# Patient Record
Sex: Female | Born: 1975 | Race: Black or African American | Hispanic: No | Marital: Single | Smoking: Never smoker
Health system: Southern US, Community
[De-identification: ages and names within clinical notes are randomized; demographics above are authoritative.]

## PROBLEM LIST (undated history)

## (undated) DIAGNOSIS — G43909 Migraine, unspecified, not intractable, without status migrainosus: Secondary | ICD-10-CM

## (undated) DIAGNOSIS — Z9109 Other allergy status, other than to drugs and biological substances: Secondary | ICD-10-CM

## (undated) HISTORY — PX: TONSILLECTOMY: SUR1361

---

## 1999-04-02 ENCOUNTER — Emergency Department (HOSPITAL_COMMUNITY): Admission: EM | Admit: 1999-04-02 | Discharge: 1999-04-02 | Payer: Self-pay

## 2000-03-01 ENCOUNTER — Emergency Department (HOSPITAL_COMMUNITY): Admission: EM | Admit: 2000-03-01 | Discharge: 2000-03-01 | Payer: Self-pay | Admitting: Emergency Medicine

## 2001-04-28 ENCOUNTER — Emergency Department (HOSPITAL_COMMUNITY): Admission: EM | Admit: 2001-04-28 | Discharge: 2001-04-28 | Payer: Self-pay | Admitting: Emergency Medicine

## 2001-08-16 ENCOUNTER — Emergency Department (HOSPITAL_COMMUNITY): Admission: EM | Admit: 2001-08-16 | Discharge: 2001-08-16 | Payer: Self-pay | Admitting: Emergency Medicine

## 2002-07-11 ENCOUNTER — Emergency Department (HOSPITAL_COMMUNITY): Admission: EM | Admit: 2002-07-11 | Discharge: 2002-07-11 | Payer: Self-pay | Admitting: Emergency Medicine

## 2003-08-07 ENCOUNTER — Emergency Department (HOSPITAL_COMMUNITY): Admission: EM | Admit: 2003-08-07 | Discharge: 2003-08-07 | Payer: Self-pay | Admitting: Emergency Medicine

## 2003-10-05 ENCOUNTER — Emergency Department (HOSPITAL_COMMUNITY): Admission: EM | Admit: 2003-10-05 | Discharge: 2003-10-05 | Payer: Self-pay | Admitting: Emergency Medicine

## 2004-01-27 ENCOUNTER — Emergency Department (HOSPITAL_COMMUNITY): Admission: EM | Admit: 2004-01-27 | Discharge: 2004-01-27 | Payer: Self-pay | Admitting: Emergency Medicine

## 2004-05-02 ENCOUNTER — Emergency Department (HOSPITAL_COMMUNITY): Admission: EM | Admit: 2004-05-02 | Discharge: 2004-05-02 | Payer: Self-pay

## 2004-05-28 ENCOUNTER — Emergency Department (HOSPITAL_COMMUNITY): Admission: EM | Admit: 2004-05-28 | Discharge: 2004-05-28 | Payer: Self-pay | Admitting: Emergency Medicine

## 2005-08-31 ENCOUNTER — Inpatient Hospital Stay (HOSPITAL_COMMUNITY): Admission: AD | Admit: 2005-08-31 | Discharge: 2005-08-31 | Payer: Self-pay | Admitting: *Deleted

## 2005-11-26 ENCOUNTER — Emergency Department (HOSPITAL_COMMUNITY): Admission: EM | Admit: 2005-11-26 | Discharge: 2005-11-26 | Payer: Self-pay | Admitting: Emergency Medicine

## 2007-09-21 ENCOUNTER — Inpatient Hospital Stay (HOSPITAL_COMMUNITY): Admission: AD | Admit: 2007-09-21 | Discharge: 2007-09-21 | Payer: Self-pay | Admitting: Obstetrics and Gynecology

## 2007-10-10 ENCOUNTER — Inpatient Hospital Stay (HOSPITAL_COMMUNITY): Admission: AD | Admit: 2007-10-10 | Discharge: 2007-10-10 | Payer: Self-pay | Admitting: *Deleted

## 2007-10-11 ENCOUNTER — Inpatient Hospital Stay (HOSPITAL_COMMUNITY): Admission: AD | Admit: 2007-10-11 | Discharge: 2007-10-13 | Payer: Self-pay | Admitting: *Deleted

## 2011-01-13 ENCOUNTER — Other Ambulatory Visit: Payer: Self-pay | Admitting: Orthopedic Surgery

## 2011-01-13 DIAGNOSIS — M25562 Pain in left knee: Secondary | ICD-10-CM

## 2011-01-16 ENCOUNTER — Ambulatory Visit
Admission: RE | Admit: 2011-01-16 | Discharge: 2011-01-16 | Disposition: A | Discharge status: Home / Self Care | Payer: Federal, State, Local not specified - PPO | Source: Ambulatory Visit | Attending: Orthopedic Surgery | Admitting: Orthopedic Surgery

## 2011-01-16 DIAGNOSIS — M25562 Pain in left knee: Secondary | ICD-10-CM

## 2011-04-19 NOTE — Discharge Summary (Signed)
Catherine Cox                 ACCOUNT NO.:  [id number]   MEDICAL RECORD NO.:  [id number]          PATIENT TYPE:  MAT   LOCATION:  [facility]                          FACILITY:  [facility]   PHYSICIAN:  Lenoard Aden, M.D.DATE OF BIRTH:  1976/10/09   DATE OF ADMISSION:  09/21/2007  DATE OF DISCHARGE:  09/21/2007                               DISCHARGE SUMMARY   CHIEF COMPLAINT:  Rule out labor, decreased fetal movement.   HISTORY OF PRESENT ILLNESS:  She is a 35 year old white female, G2, P1  who presents for increased frequency of contractions and decreased fetal  movement.   ALLERGIES:  CODEINE.   SOCIAL HISTORY:  Non-smoker. Non-drinker. Denies domestic or physical  violence.   PAST MEDICAL HISTORY:  History of hypertension, throat cancer, migraines  and lymphoma. She has a personal history of migraine headaches,  tonsillectomy, urinary tract infection and history of vaginal delivery  x1 with precipitous delivery noted. Prenatal course today uncomplicated.   PHYSICAL EXAMINATION:  GENERAL:  A well developed, well nourished female  in no acute distress.  VITAL SIGNS:  Blood pressure 100/70, weight 158 pounds.  HEENT:  Normal.  LUNGS:  Clear.  HEART:  Regular rhythm.  ABDOMEN:  Soft, gravid, nontender.  GENITOURINARY:  Cervix is 1 cm, thick, vertex and minus 1.  EXTREMITIES:  Without cords.  NEUROLOGIC:  Non-focal.  SKIN:  Intact.   LABORATORY DATA:  NST is reactive. Rare contractions are noted.   IMPRESSION:  1. A 36-5/7 week OB.  2. Prodromal labor pattern.  3. Reactive NST, reassurance given.   PLAN:  1. Discharge home.  2. Ambien p.r.n.  3. Followup in the office as scheduled.      Lenoard Aden, M.D.  Electronically Signed     RJT/MEDQ  D:  09/21/2007  T:  09/23/2007  Job:  [id number]

## 2011-09-13 LAB — CBC
HCT: 29 — ABNORMAL LOW
HCT: 37.5
Hemoglobin: 13.5
MCHC: 35.8
MCHC: 35.9
MCV: 96.1
Platelets: 150
RBC: 3.01 — ABNORMAL LOW
RBC: 3.97
WBC: 10.6 — ABNORMAL HIGH

## 2011-09-14 LAB — URINALYSIS, ROUTINE W REFLEX MICROSCOPIC
Bilirubin Urine: NEGATIVE
Glucose, UA: NEGATIVE
Ketones, ur: 15 — AB
Protein, ur: NEGATIVE
pH: 6

## 2011-09-14 LAB — URINE MICROSCOPIC-ADD ON: RBC / HPF: NONE SEEN

## 2013-04-18 ENCOUNTER — Emergency Department (HOSPITAL_COMMUNITY): Payer: Federal, State, Local not specified - PPO

## 2013-04-18 ENCOUNTER — Emergency Department (HOSPITAL_COMMUNITY)
Admission: EM | Admit: 2013-04-18 | Discharge: 2013-04-18 | Disposition: A | Discharge status: Home / Self Care | Payer: Federal, State, Local not specified - PPO | Attending: Emergency Medicine | Admitting: Emergency Medicine

## 2013-04-18 ENCOUNTER — Encounter (HOSPITAL_COMMUNITY): Payer: Self-pay | Admitting: Emergency Medicine

## 2013-04-18 DIAGNOSIS — S93602A Unspecified sprain of left foot, initial encounter: Secondary | ICD-10-CM

## 2013-04-18 DIAGNOSIS — Y9301 Activity, walking, marching and hiking: Secondary | ICD-10-CM | POA: Insufficient documentation

## 2013-04-18 DIAGNOSIS — Y929 Unspecified place or not applicable: Secondary | ICD-10-CM | POA: Insufficient documentation

## 2013-04-18 DIAGNOSIS — Z9109 Other allergy status, other than to drugs and biological substances: Secondary | ICD-10-CM | POA: Insufficient documentation

## 2013-04-18 DIAGNOSIS — G43909 Migraine, unspecified, not intractable, without status migrainosus: Secondary | ICD-10-CM | POA: Insufficient documentation

## 2013-04-18 DIAGNOSIS — X500XXA Overexertion from strenuous movement or load, initial encounter: Secondary | ICD-10-CM | POA: Insufficient documentation

## 2013-04-18 DIAGNOSIS — S93609A Unspecified sprain of unspecified foot, initial encounter: Secondary | ICD-10-CM | POA: Insufficient documentation

## 2013-04-18 HISTORY — DX: Migraine, unspecified, not intractable, without status migrainosus: G43.909

## 2013-04-18 HISTORY — DX: Other allergy status, other than to drugs and biological substances: Z91.09

## 2013-04-18 MED ORDER — TRAMADOL HCL 50 MG PO TABS: 50.0000 mg | ORAL_TABLET | Freq: Four times a day (QID) | ORAL | Status: DC | PRN

## 2013-04-18 NOTE — ED Notes (Signed)
Patient transported to X-ray 

## 2013-04-18 NOTE — ED Provider Notes (Signed)
Medical screening examination/treatment/procedure(s) were performed by non-physician practitioner and as supervising physician I was immediately available for consultation/collaboration.   Carleene Cooper III, MD 04/18/13 [phone]

## 2013-04-18 NOTE — ED Notes (Signed)
Pt was walking down the stairs and was at the bottom step when she twisted her left foot and heard a "pop."  Pain with walking and noted swelling.

## 2013-04-18 NOTE — ED Provider Notes (Signed)
History     CSN: [id number]  Arrival date & time 04/18/13  [id number]   First MD Initiated Contact with Patient 04/18/13 [phone]      Chief Complaint  Patient presents with  . Foot Injury    Left    (Consider location/radiation/quality/duration/timing/severity/associated sxs/prior treatment) HPI Comments: Patient presents with a chief complaint of left foot pain.  Pain associated with swelling.  Pain and swelling located over the 4th and 5th metatarsal.  She reports that last evening she twisted her left foot foot while walking down the stairs.  She reports that initially she felt some tingling, but no tingling since that time.  She took a Human resources officer with mild relief.  She has been able to ambulate since the injury.  Patient is a 37 y.o. female presenting with foot injury. The history is provided by the patient.  Foot Injury Location:  Foot Injury: yes   Foot location:  L foot Pain details:    Radiates to:  Does not radiate   Onset quality:  Sudden   Progression:  Worsening Ineffective treatments:  Ice Associated symptoms: swelling   Associated symptoms: no decreased ROM, no fever, no numbness and no tingling     Past Medical History  Diagnosis Date  . Migraine headache   . Environmental allergies     Past Surgical History  Procedure Laterality Date  . Tonsillectomy      No family history on file.  History  Substance Use Topics  . Smoking status: Never Smoker   . Smokeless tobacco: Not on file  . Alcohol Use: No    OB History   Grav Para Term Preterm Abortions TAB SAB Ect Mult Living                  Review of Systems  Constitutional: Negative for fever.  Musculoskeletal:       Left foot pain  All other systems reviewed and are negative.    Allergies  Codeine  Home Medications   Current Outpatient Rx  Name  Route  Sig  Dispense  Refill  . cyclobenzaprine (FLEXERIL) 10 MG tablet   Oral   Take 10 mg by mouth 3 (three) times daily as needed for  muscle spasms.         . medroxyPROGESTERone (DEPO-PROVERA) 150 MG/ML injection   Intramuscular   Inject 150 mg into the muscle every 3 (three) months.         . rizatriptan (MAXALT) 10 MG tablet   Oral   Take 10 mg by mouth as needed for migraine. May repeat in 2 hours if needed           BP 128/86  Pulse 109  Temp(Src) 98.7 F (37.1 C) (Oral)  Resp 18  Ht 5\' 8"  (1.727 m)  Wt 181 lb (82.101 kg)  BMI 27.53 kg/m2  SpO2 100%  Physical Exam  Nursing note and vitals reviewed. Constitutional: She appears well-developed and well-nourished. No distress.  HENT:  Head: Normocephalic and atraumatic.  Mouth/Throat: Oropharynx is clear and moist.  Cardiovascular: Normal rate, regular rhythm and normal heart sounds.   Pulses:      Dorsalis pedis pulses are 2+ on the right side, and 2+ on the left side.  Pulmonary/Chest: Effort normal and breath sounds normal.  Musculoskeletal:       Left ankle: She exhibits normal range of motion, no swelling, no deformity and normal pulse. No tenderness. No lateral malleolus and no medial malleolus tenderness found.  Tenderness and swelling of the 4th and 5th metatarsal of the left foot.  Neurological: She is alert. No sensory deficit.  Skin: Skin is warm and dry. She is not diaphoretic.  Psychiatric: She has a normal mood and affect.    ED Course  Procedures (including critical care time)  Labs Reviewed - No data to display Dg Foot Complete Left  04/18/2013   *RADIOLOGY REPORT*  Clinical Data: Foot injury with pain  LEFT FOOT - COMPLETE 3+ VIEW  Comparison: None  Findings: Negative for fracture.  Negative for arthropathy.  No focal bony abnormality.  IMPRESSION: Negative   Original Report Authenticated By: Janeece Riggers, M.D.     No diagnosis found.    MDM  Patient presenting with left foot pain after twisting it.  Xray negative.  Patient neurovascularly intact.  Patient declined crutches.  ACE wrap applied to the foot and patient  discharged home.        Pascal Lux Vining, PA-C 04/18/13 [phone]

## 2014-02-13 ENCOUNTER — Other Ambulatory Visit (HOSPITAL_COMMUNITY): Payer: Self-pay | Admitting: Obstetrics and Gynecology

## 2014-02-13 DIAGNOSIS — Z1231 Encounter for screening mammogram for malignant neoplasm of breast: Secondary | ICD-10-CM

## 2014-02-27 ENCOUNTER — Ambulatory Visit (HOSPITAL_COMMUNITY)
Admission: RE | Admit: 2014-02-27 | Discharge: 2014-02-27 | Disposition: A | Discharge status: Home / Self Care | Payer: Federal, State, Local not specified - PPO | Source: Ambulatory Visit | Attending: Obstetrics and Gynecology | Admitting: Obstetrics and Gynecology

## 2014-02-27 DIAGNOSIS — Z1231 Encounter for screening mammogram for malignant neoplasm of breast: Secondary | ICD-10-CM

## 2014-02-27 LAB — HM MAMMOGRAPHY

## 2014-08-14 ENCOUNTER — Encounter (HOSPITAL_COMMUNITY): Payer: Self-pay | Admitting: Emergency Medicine

## 2014-08-14 ENCOUNTER — Emergency Department (HOSPITAL_COMMUNITY)
Admission: EM | Admit: 2014-08-14 | Discharge: 2014-08-14 | Disposition: A | Discharge status: Home / Self Care | Payer: Federal, State, Local not specified - PPO | Attending: Emergency Medicine | Admitting: Emergency Medicine

## 2014-08-14 ENCOUNTER — Emergency Department (HOSPITAL_COMMUNITY): Payer: Federal, State, Local not specified - PPO

## 2014-08-14 DIAGNOSIS — M1710 Unilateral primary osteoarthritis, unspecified knee: Secondary | ICD-10-CM | POA: Insufficient documentation

## 2014-08-14 DIAGNOSIS — M25569 Pain in unspecified knee: Secondary | ICD-10-CM | POA: Diagnosis present

## 2014-08-14 DIAGNOSIS — G43909 Migraine, unspecified, not intractable, without status migrainosus: Secondary | ICD-10-CM | POA: Insufficient documentation

## 2014-08-14 DIAGNOSIS — M1990 Unspecified osteoarthritis, unspecified site: Secondary | ICD-10-CM

## 2014-08-14 DIAGNOSIS — Z79899 Other long term (current) drug therapy: Secondary | ICD-10-CM | POA: Insufficient documentation

## 2014-08-14 DIAGNOSIS — M25562 Pain in left knee: Secondary | ICD-10-CM

## 2014-08-14 DIAGNOSIS — IMO0002 Reserved for concepts with insufficient information to code with codable children: Principal | ICD-10-CM

## 2014-08-14 NOTE — ED Notes (Signed)
Pt states yesterday she walking and felt left knee pop and after that with walking she felt knee popping. Pt states she elevated and put ice on last night and today knee hurts worse and is swelling.

## 2014-08-14 NOTE — Discharge Instructions (Signed)
Please call your doctor for a followup appointment within 24-48 hours. When you talk to your doctor please let them know that you were seen in the emergency department and have them acquire all of your records so that they can discuss the findings with you and formulate a treatment plan to fully care for your new and ongoing problems. Please call and set-up an appointment with Orthopedics Please rest, ice, elevate - keep knee in mildly bent fashion  While taking ibuprofen please take on a full stomach for this can cause upset stomach Please wear knee sleeve for comfort especially when working Please continue to monitor symptoms closely and if symptoms are to worsen or change (fever greater than 101, chills, sweating, nausea, vomiting, chest pain, shortness of breathe, difficulty breathing, weakness, numbness, tingling, worsening or changes to pain pattern, fall, injury, hot to touch, changes to skin color) please report back to the Emergency Department immediately.   Knee Pain The knee is the complex joint between your thigh and your lower leg. It is made up of bones, tendons, ligaments, and cartilage. The bones that make up the knee are:  The femur in the thigh.  The tibia and fibula in the lower leg.  The patella or kneecap riding in the groove on the lower femur. CAUSES  Knee pain is a common complaint with many causes. A few of these causes are:  Injury, such as:  A ruptured ligament or tendon injury.  Torn cartilage.  Medical conditions, such as:  Gout  Arthritis  Infections  Overuse, over training, or overdoing a physical activity. Knee pain can be minor or severe. Knee pain can accompany debilitating injury. Minor knee problems often respond well to self-care measures or get well on their own. More serious injuries may need medical intervention or even surgery. SYMPTOMS The knee is complex. Symptoms of knee problems can vary widely. Some of the problems are:  Pain with  movement and weight bearing.  Swelling and tenderness.  Buckling of the knee.  Inability to straighten or extend your knee.  Your knee locks and you cannot straighten it.  Warmth and redness with pain and fever.  Deformity or dislocation of the kneecap. DIAGNOSIS  Determining what is wrong may be very straight forward such as when there is an injury. It can also be challenging because of the complexity of the knee. Tests to make a diagnosis may include:  Your caregiver taking a history and doing a physical exam.  Routine X-rays can be used to rule out other problems. X-rays will not reveal a cartilage tear. Some injuries of the knee can be diagnosed by:  Arthroscopy a surgical technique by which a small video camera is inserted through tiny incisions on the sides of the knee. This procedure is used to examine and repair internal knee joint problems. Tiny instruments can be used during arthroscopy to repair the torn knee cartilage (meniscus).  Arthrography is a radiology technique. A contrast liquid is directly injected into the knee joint. Internal structures of the knee joint then become visible on X-ray film.  An MRI scan is a non X-ray radiology procedure in which magnetic fields and a computer produce two- or three-dimensional images of the inside of the knee. Cartilage tears are often visible using an MRI scanner. MRI scans have largely replaced arthrography in diagnosing cartilage tears of the knee.  Blood work.  Examination of the fluid that helps to lubricate the knee joint (synovial fluid). This is done by taking  a sample out using a needle and a syringe. TREATMENT The treatment of knee problems depends on the cause. Some of these treatments are:  Depending on the injury, proper casting, splinting, surgery, or physical therapy care will be needed.  Give yourself adequate recovery time. Do not overuse your joints. If you begin to get sore during workout routines, back off.  Slow down or do fewer repetitions.  For repetitive activities such as cycling or running, maintain your strength and nutrition.  Alternate muscle groups. For example, if you are a weight lifter, work the upper body on one day and the lower body the next.  Either tight or weak muscles do not give the proper support for your knee. Tight or weak muscles do not absorb the stress placed on the knee joint. Keep the muscles surrounding the knee strong.  Take care of mechanical problems.  If you have flat feet, orthotics or special shoes may help. See your caregiver if you need help.  Arch supports, sometimes with wedges on the inner or outer aspect of the heel, can help. These can shift pressure away from the side of the knee most bothered by osteoarthritis.  A brace called an "unloader" brace also may be used to help ease the pressure on the most arthritic side of the knee.  If your caregiver has prescribed crutches, braces, wraps or ice, use as directed. The acronym for this is PRICE. This means protection, rest, ice, compression, and elevation.  Nonsteroidal anti-inflammatory drugs (NSAIDs), can help relieve pain. But if taken immediately after an injury, they may actually increase swelling. Take NSAIDs with food in your stomach. Stop them if you develop stomach problems. Do not take these if you have a history of ulcers, stomach pain, or bleeding from the bowel. Do not take without your caregiver's approval if you have problems with fluid retention, heart failure, or kidney problems.  For ongoing knee problems, physical therapy may be helpful.  Glucosamine and chondroitin are over-the-counter dietary supplements. Both may help relieve the pain of osteoarthritis in the knee. These medicines are different from the usual anti-inflammatory drugs. Glucosamine may decrease the rate of cartilage destruction.  Injections of a corticosteroid drug into your knee joint may help reduce the symptoms of an  arthritis flare-up. They may provide pain relief that lasts a few months. You may have to wait a few months between injections. The injections do have a small increased risk of infection, water retention, and elevated blood sugar levels.  Hyaluronic acid injected into damaged joints may ease pain and provide lubrication. These injections may work by reducing inflammation. A series of shots may give relief for as long as 6 months.  Topical painkillers. Applying certain ointments to your skin may help relieve the pain and stiffness of osteoarthritis. Ask your pharmacist for suggestions. Many over the-counter products are approved for temporary relief of arthritis pain.  In some countries, doctors often prescribe topical NSAIDs for relief of chronic conditions such as arthritis and tendinitis. A review of treatment with NSAID creams found that they worked as well as oral medications but without the serious side effects. PREVENTION  Maintain a healthy weight. Extra pounds put more strain on your joints.  Get strong, stay limber. Weak muscles are a common cause of knee injuries. Stretching is important. Include flexibility exercises in your workouts.  Be smart about exercise. If you have osteoarthritis, chronic knee pain or recurring injuries, you may need to change the way you exercise. This does  not mean you have to stop being active. If your knees ache after jogging or playing basketball, consider switching to swimming, water aerobics, or other low-impact activities, at least for a few days a week. Sometimes limiting high-impact activities will provide relief.  Make sure your shoes fit well. Choose footwear that is right for your sport.  Protect your knees. Use the proper gear for knee-sensitive activities. Use kneepads when playing volleyball or laying carpet. Buckle your seat belt every time you drive. Most shattered kneecaps occur in car accidents.  Rest when you are tired. SEEK MEDICAL CARE IF:   You have knee pain that is continual and does not seem to be getting better.  SEEK IMMEDIATE MEDICAL CARE IF:  Your knee joint feels hot to the touch and you have a high fever. MAKE SURE YOU:   Understand these instructions.  Will watch your condition.  Will get help right away if you are not doing well or get worse. Document Released: 09/18/2007 Document Revised: 02/13/2012 Document Reviewed: 09/18/2007 [facility] Patient Information 2015 [place], [place]. This information is not intended to replace advice given to you by your health care provider. Make sure you discuss any questions you have with your health care provider.   Emergency Department Resource Guide 1) Find a Doctor and Pay Out of Pocket Although you won't have to find out who is covered by your insurance plan, it is a good idea to ask around and get recommendations. You will then need to call the office and see if the doctor you have chosen will accept you as a new patient and what types of options they offer for patients who are self-pay. Some doctors offer discounts or will set up payment plans for their patients who do not have insurance, but you will need to ask so you aren't surprised when you get to your appointment.  2) Contact Your Local Health Department Not all health departments have doctors that can see patients for sick visits, but many do, so it is worth a call to see if yours does. If you don't know where your local health department is, you can check in your phone book. The CDC also has a tool to help you locate your state's health department, and many state websites also have listings of all of their local health departments.  3) Find a [facility] If your illness is not likely to be very severe or complicated, you may want to try a walk in clinic. These are popping up all over the country in pharmacies, drugstores, and shopping centers. They're usually staffed by nurse practitioners or physician assistants that  have been trained to treat common illnesses and complaints. They're usually fairly quick and inexpensive. However, if you have serious medical issues or chronic medical problems, these are probably not your best option.  No Primary Care Doctor: - Call Health Connect at  [phone] - they can help you locate a primary care doctor that  accepts your insurance, provides certain services, etc. - Physician Referral Service- [phone]  Chronic Pain Problems: Organization         Address  Phone   Notes  [facility]  [phone] Patients need to be referred by their primary care doctor.   Medication Assistance: Organization         Address  Phone   Notes  [facility] Medication [facility] [place], [place] [address] [phone] --Must be a resident of [place]  [place] -- Must have NO insurance coverage whatsoever (no Medicaid/ Medicare, etc.) -- The pt. MUST have a primary care doctor that directs their care regularly and follows them in the community   MedAssist  [phone]   [employer]  [phone]    Agencies that provide inexpensive medical care: Organization         Address  Phone   Notes  [place]  [phone]   [facility]    [phone]   [facility] [place], [place] [address] [phone]   [place] [address] [place] [phone]   Planned Parenthood    [phone]   [facility]    [phone]   [place] and [address], [place] Phone:  [phone], Fax:  [phone] Hours of Operation:  9 am - 6 pm, M-F.  Also accepts Medicaid/Medicare and self-pay.  [facility] for [place] [address], [address], [place] Phone: [phone], Fax: [phone]. Hours of Operation:  8:30 am - 5:30 pm, M-F.  Also accepts Medicaid and  self-pay.  [facility] [place] [address] [place] Phone: [phone]   [place], [place], [place] [phone], Ext. [phone] Mondays & Thursdays: 7-9 AM.  First 15 patients are seen on a first come, first serve basis.    [place] Providers:  Organization         Address  Phone   Notes  [facility] [address], [place] [phone] Also accepts self-pay patients.  [facility] [address], [place]  [phone]   [place], [address], [place] [phone]   [facility] [address] [place] [phone]   Lucianne Lei [address], [place]   [phone] Only accepts [place] Access [place] patients after they have their name applied to their card.   Self-Pay (no insurance) in [facility]:  Organization         Address  Phone   Notes  Sickle Cell Patients, [facility] [place] [phone]   [facility] Urgent Care [place] [phone]   [facility] [place]  [place], [place], [place] [phone]   [facility]/Dr. Osei-Bonsu  [address], [place] or [address], [place] [phone] Phone number for both [place] and [place] locations is the same.  Urgent Medical and [facility] [address] [place] [phone]   [facility] [address] [place] or [address] [phone]   [facility] [address] [place] [phone], phone; [phone], fax Sees patients 1st and 3rd Saturday of every month.  Must not qualify for public or private insurance (i.e. Medicaid, Medicare, [place] Health Choice, Veterans' Benefits)  Household income  should be no more than 200% of the poverty level The clinic cannot treat you if you are pregnant or think you are pregnant  Sexually transmitted diseases are not treated at the clinic.    Dental Care: Organization         Address  Phone  Notes  [facility] [place] [phone] Accepts children up to age 77 who are enrolled in [place] or [place]; pregnant women with a Medicaid card; and children who have applied for Medicaid or [place] Health Choice, but were declined, whose parents can pay a reduced fee at time of service.  [facility]  [address], [place] [phone] Accepts children up to age 41 who are enrolled in [place] or [place]; pregnant women with a Medicaid card; and children who have applied for Medicaid or  Health Choice, but were declined, whose parents can pay a reduced fee at time of service.  [place] Adult Dental Access PROGRAM  [place] [phone] Patients are seen by appointment only. Walk-ins are not accepted. [place] will see patients 54 years of age and older. Monday - Tuesday (8am-5pm) Most Wednesdays (8:30-5pm) $30 per visit, cash only  [facility] Adult Dental Access PROGRAM  [address], [facility] [phone] Patients are seen by appointment only. Walk-ins are not accepted. [place] will see patients 24 years of age and older. One Wednesday Evening (Monthly: Volunteer Based).  $30 per visit, cash only  [place]  [phone] for adults; Children under age 17, call Graduate Pediatric Dentistry at [phone]. Children aged 30-14, please call [phone] to request a pediatric application.  Dental services are provided in all areas of dental care including fillings, crowns and bridges, complete and partial dentures, implants, gum treatment,  root canals, and extractions. Preventive care is also provided. Treatment is provided to both adults and children. Patients are selected via a lottery and there is often a waiting list.   [facility] [place] [address] [place]  [phone] [email or web address]   [facility] [address] [place], [place] [phone], Ext. [phone] Second and Fourth Thursday of each month, opens at 6:30 AM; Clinic ends at 9 AM.  Patients are seen on a first-come first-served basis, and a limited number are seen during each clinic.   [facility]  [address] [place] Danker [place], [place] [phone]   Eligibility Requirements You must have lived in [place], [place], or [place] counties for at least the last three months.   You cannot be eligible for state or federal sponsored [employer], including [employer], [place], or [employer].   You generally cannot be eligible for healthcare insurance through your employer.    How to apply: Eligibility screenings are held every Tuesday and Wednesday afternoon from 1:00 pm until 4:00 pm. You do not need an appointment for the interview!  [facility] [address] [place], [place]   [place]  [place] Department  [place]  [phone]    Behavioral Health Resources in the Community: Intensive Outpatient Programs Organization         Address  Phone  Notes  [place]. [address], [place], [place] [phone]   [facility] Outpatient [address] [place], [place]   ADS: Alcohol & Drug Svcs [address] [place], [place]   [place] [address],  [employer], [place] or [phone]   Substance Abuse Resources Organization  Address  Phone  Notes  Alcohol and Drug Services   [phone]   [employer]  [phone]   [facility] [place]  [phone]   Chinita Pester  [phone]   Residential & Outpatient Substance Abuse Program  [phone]   Psychological Services Organization         Address  Phone  Notes  [facility] [place]  [place]  [phone]   [place] [address] [place] or [phone]    Mobile Crisis Teams Organization         Address  Phone  Notes  [facility], Mobile Crisis Care Unit  [phone]   [facility]  [address] [place], [place]   [place] [address] [place] [phone]    Self-Help/Support Groups Organization         Address  Phone             Notes  [place]. of [place] - variety of support groups  [place] Call for more information  Narcotics Anonymous (NA), [facility] [address] [place] [address], [employer]   2 meetings at this location   Special educational needs teacher         Address  Phone  Notes  ASAP Residential Treatment [place],    [place]  [phone]   [facility]  [address] [place] [address], [place], [place]   [place], [place] [phone] Admissions: 8am-3pm M-F  Incentives Substance [place] [address],    [place], [place] [phone]   [facility] [address] [place], [employer], [place]   [facility] [address]  [place], [place]   [facility] Programs - Intensive Outpatient Center [address] [place]een [address], [place], [place]   [facility] [place].,  [place], [place] [phone] or [phone]   [facility] (RT[facility]) [address], [place], [place] Accepts Medicaid  [facility] [place] [address],  [place] [phone] Substance Abuse/Addiction Treatment   [facility] Organization         Address  Phone  Notes  [facility]  [phone]   Domenic Schwab, PhD [address] Arlis Porta [place], [place]   [phone] or [phone]   [place], [place] [phone]   [facility] [address] [place], [place] [phone] Insurance/Medicaid/sponsorship through [facility] and Families [address], [address] [place]                                    [place], [place] [phone] [place] [address], [place] [phone]    Dr. Adele Schilder  [phone]   [facility] Dept. 1) [address] [place] 2) [place] 3)  [place] [address], [place] [phone]  [phone]   [place] [phone] or [phone] (After Hours)

## 2014-08-14 NOTE — ED Provider Notes (Signed)
Medical screening examination/treatment/procedure(s) were conducted as a shared visit with non-physician practitioner(s) and myself.  I personally evaluated the patient during the encounter.   EKG Interpretation None      I interviewed and examined the patient. Lungs are CTAB. Cardiac exam wnl. Abdomen soft.  Mild ttp of medial and inf patellar area of left knee. Normal rom of left knee. Possible sprain. Will rec RICE and f/u as needed.   Pamella Pert, MD 08/14/14 [phone]

## 2014-08-14 NOTE — ED Notes (Signed)
Patient to xray.

## 2014-08-14 NOTE — ED Provider Notes (Signed)
CSN: [id number]     Arrival date & time 08/14/14  1638 History   First MD Initiated Contact with Patient 08/14/14 [phone]     Chief Complaint  Patient presents with  . Knee Pain     (Consider location/radiation/quality/duration/timing/severity/associated sxs/prior Treatment) The history is provided by the patient. No language interpreter was used.  Catherine Cox is a 38 y/o F with PMHx of migraines and environmental allergies presenting to the ED with left knee pain. Patient reported that she felt a popping sensation in her left knee resulting in a constant pain - described as a throbbing dull ache, denied radiation. Stated that when she got home she elevated, ice to the left knee along with ibuprofen. 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 Denied previous injury, fall, injury, numbness, tingling, loss sensation. PCP none  Past Medical History  Diagnosis Date  . Migraine headache   . Environmental allergies    Past Surgical History  Procedure Laterality Date  . Tonsillectomy     No family history on file. History  Substance Use Topics  . Smoking status: Never Smoker   . Smokeless tobacco: Not on file  . Alcohol Use: No   OB History   Grav Para Term Preterm Abortions TAB SAB Ect Mult Living                 Review of Systems  Musculoskeletal: Positive for arthralgias (Left knee pain). Negative for gait problem and myalgias.  Neurological: Negative for weakness and numbness.      Allergies  Codeine  Home Medications   Prior to Admission medications   Medication Sig Start Date End Date Taking? Authorizing Provider  cyclobenzaprine (FLEXERIL) 10 MG tablet Take 10 mg by mouth 3 (three) times daily as needed for muscle spasms.    Historical Provider, MD  medroxyPROGESTERone (DEPO-PROVERA) 150 MG/ML injection Inject 150 mg into the muscle every 3 (three) months.    Historical  Provider, MD  rizatriptan (MAXALT) 10 MG tablet Take 10 mg by mouth as needed for migraine. May repeat in 2 hours if needed    Historical Provider, MD  traMADol (ULTRAM) 50 MG tablet Take 1 tablet (50 mg total) by mouth every 6 (six) hours as needed for pain. 04/18/13   Heather Laisure, PA-C   BP 130/89  Pulse 83  Temp(Src) 98.5 F (36.9 C) (Oral)  Resp 20  SpO2 100% Physical Exam  Nursing note and vitals reviewed. Constitutional: She is oriented to person, place, and time. She appears well-developed and well-nourished. No distress.  HENT:  Head: Normocephalic and atraumatic.  Eyes: Conjunctivae and EOM are normal. Right eye exhibits no discharge. Left eye exhibits no discharge.  Cardiovascular: Normal rate, regular rhythm and normal heart sounds.   Pulses:      Radial pulses are 2+ on the right side, and 2+ on the left side.       Dorsalis pedis pulses are 2+ on the right side, and 2+ on the left side.  Pulmonary/Chest: Effort normal and breath sounds normal.  Musculoskeletal: Normal range of motion. She exhibits edema and tenderness.       Legs: Negative swelling, erythema, inflammation, lesions, sores, deformities, malalignment identified to the left knee. Discomfort upon palpation to the medial and inferior aspect of the patella. Negative warmth upon palpation. Full flexion extension identified. Negative valgus and varus tension. Stable left knee  joint.  Neurological: She is alert and oriented to person, place, and time. She exhibits normal muscle tone. Coordination normal.  Strength 5+/5+ to lower extremities bilaterally with resistance applied, equal distribution noted Strength intact to digits of the feet bilaterally  Sensation intact with differentiation to sharp and dull touch   Skin: Skin is warm and dry. No rash noted. She is not diaphoretic. No erythema.  Psychiatric: She has a normal mood and affect. Her behavior is normal. Thought content normal.    ED Course  Procedures  (including critical care time) Labs Review Labs Reviewed - No data to display  Imaging Review Dg Knee Complete 4 Views Left  08/14/2014   CLINICAL DATA:  Pain and swelling below the patella.  Knee pain.  EXAM: LEFT KNEE - COMPLETE 4+ VIEW  COMPARISON:  MRI of the left knee 01/16/2011  FINDINGS: The knee is located. There is no significant effusion. 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 No acute osseous abnormality is present. There is no radiopaque foreign body.  IMPRESSION: 1. Mild tricompartmental degenerative change. 2. No acute abnormality or focal soft tissue injury.   Electronically Signed   By: Lawrence Santiago M.D.   On: 08/14/2014 08:45     EKG Interpretation None      MDM   Final diagnoses:  Left knee pain  Arthritis    Filed Vitals:   08/14/14 0818  BP: 130/89  Pulse: 83  Temp: 98.5 F (36.9 C)  TempSrc: Oral  Resp: 20  SpO2: 100%   Left knee plain film noted mild tricompartmental degenerative change with no acute abnormalities or focal soft tissue injury. Doubt septic joint. Negative signs of ischemia. Imaging unremarkable. Patient neurovascularly intact. Full range of motion noted. Pulses palpable and strong. Patient stable, afebrile. Patient septic appearing. Discharged patient. Suspicion of pain to be due to arthritis the knee. Discussed with patient to rest, ice, elevate. Patient placed in the sleeve. Referred to orthopedics. Discussed with patient to closely monitor symptoms and if symptoms are to worsen or change to report back to the ED - strict return instructions given.  Patient agreed to plan of care, understood, all questions answered.   Jamse Mead, PA-C 08/14/14 [phone]

## 2015-03-24 LAB — HM MAMMOGRAPHY

## 2015-10-23 LAB — HEMOGLOBIN A1C: Hemoglobin A1C: 5.5

## 2015-10-23 LAB — VITAMIN D 25 HYDROXY (VIT D DEFICIENCY, FRACTURES): VIT D 25 HYDROXY: 20.3

## 2015-10-23 LAB — HM PAP SMEAR: HM Pap smear: NEGATIVE

## 2016-03-24 DIAGNOSIS — Z1231 Encounter for screening mammogram for malignant neoplasm of breast: Secondary | ICD-10-CM | POA: Diagnosis not present

## 2016-03-24 LAB — HM MAMMOGRAPHY

## 2016-04-18 DIAGNOSIS — Z3042 Encounter for surveillance of injectable contraceptive: Secondary | ICD-10-CM | POA: Diagnosis not present

## 2016-05-04 IMAGING — CR DG KNEE COMPLETE 4+V*L*
5 series · 5 of 5 positions shown · non-contrast
Comparison: MRI of the left knee 01/16/2011

CLINICAL DATA: Pain and swelling below the patella.  Knee pain.

EXAM:
LEFT KNEE - COMPLETE 4+ VIEW

[t knee ap left]
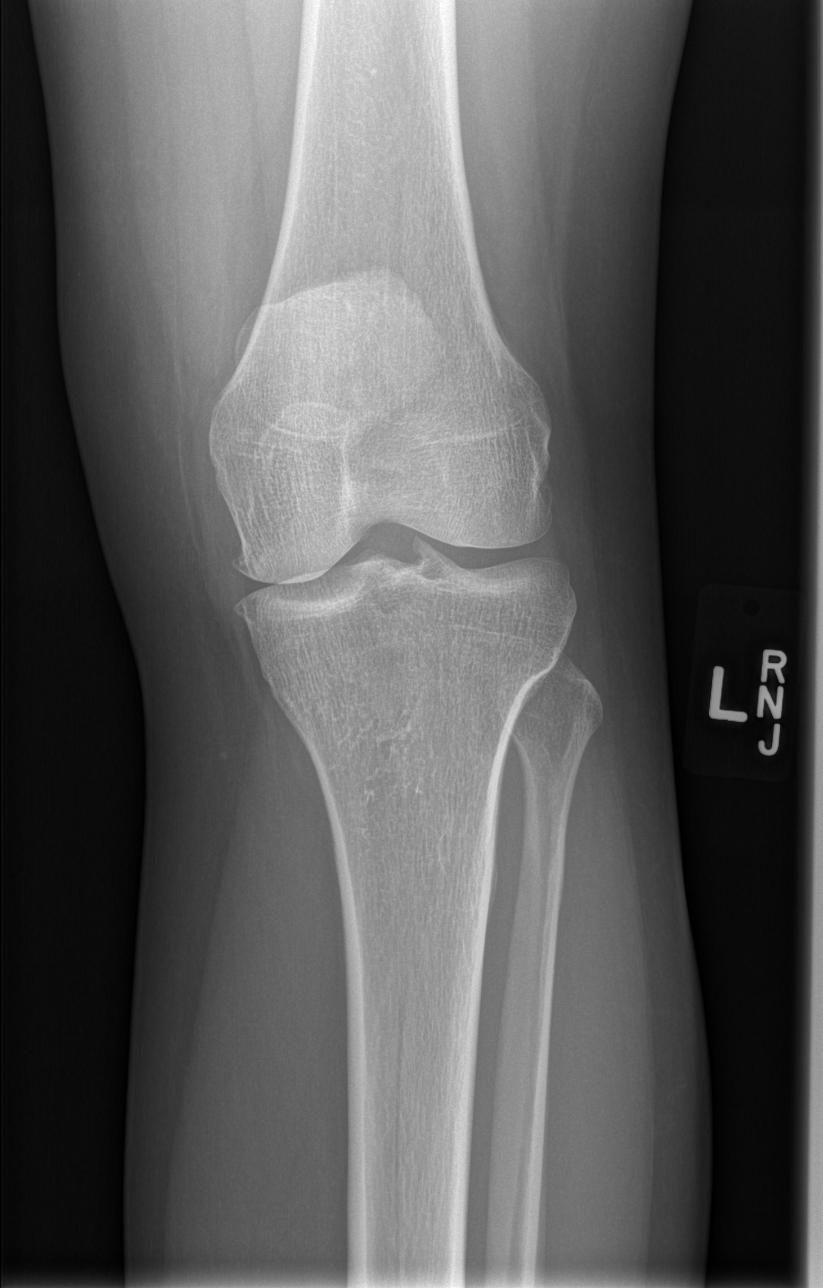

[t knee obl left (1 of 2)]
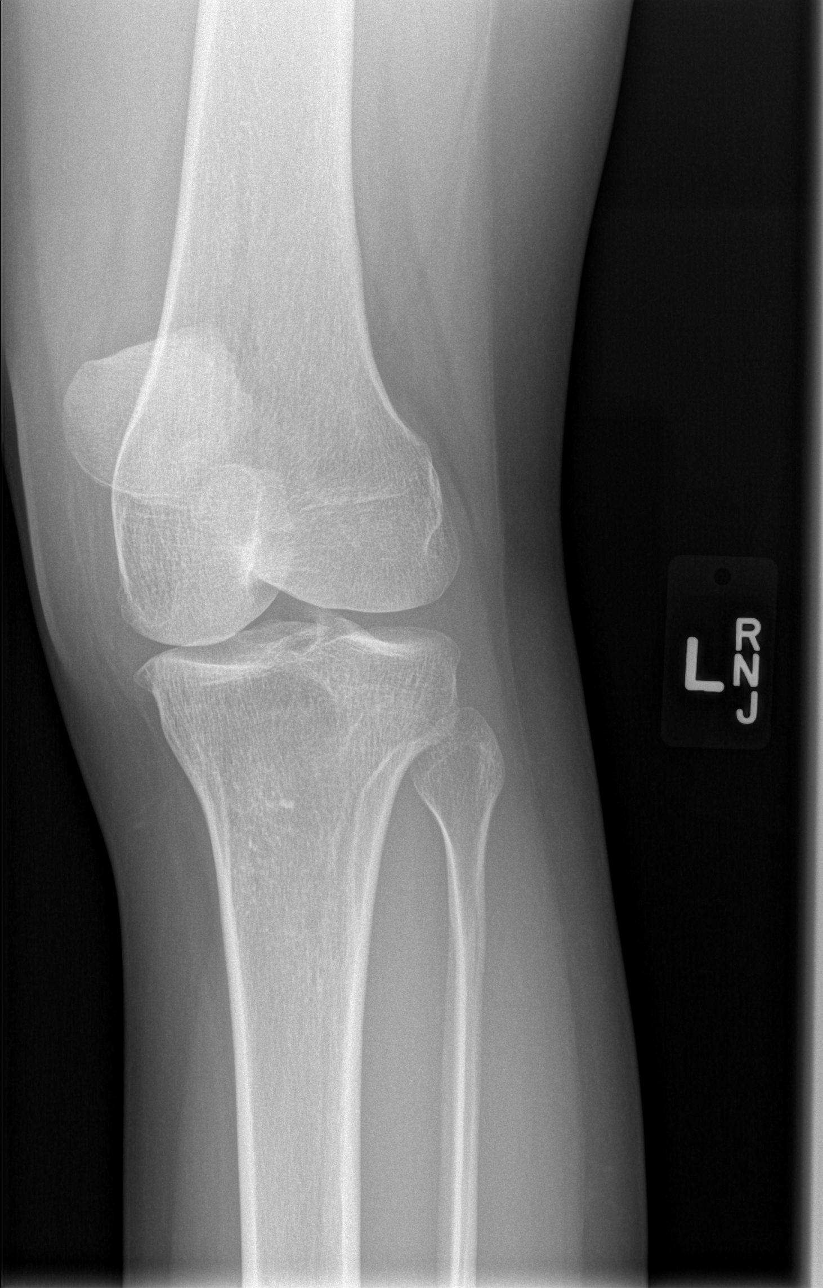

[t knee obl left (2 of 2)]
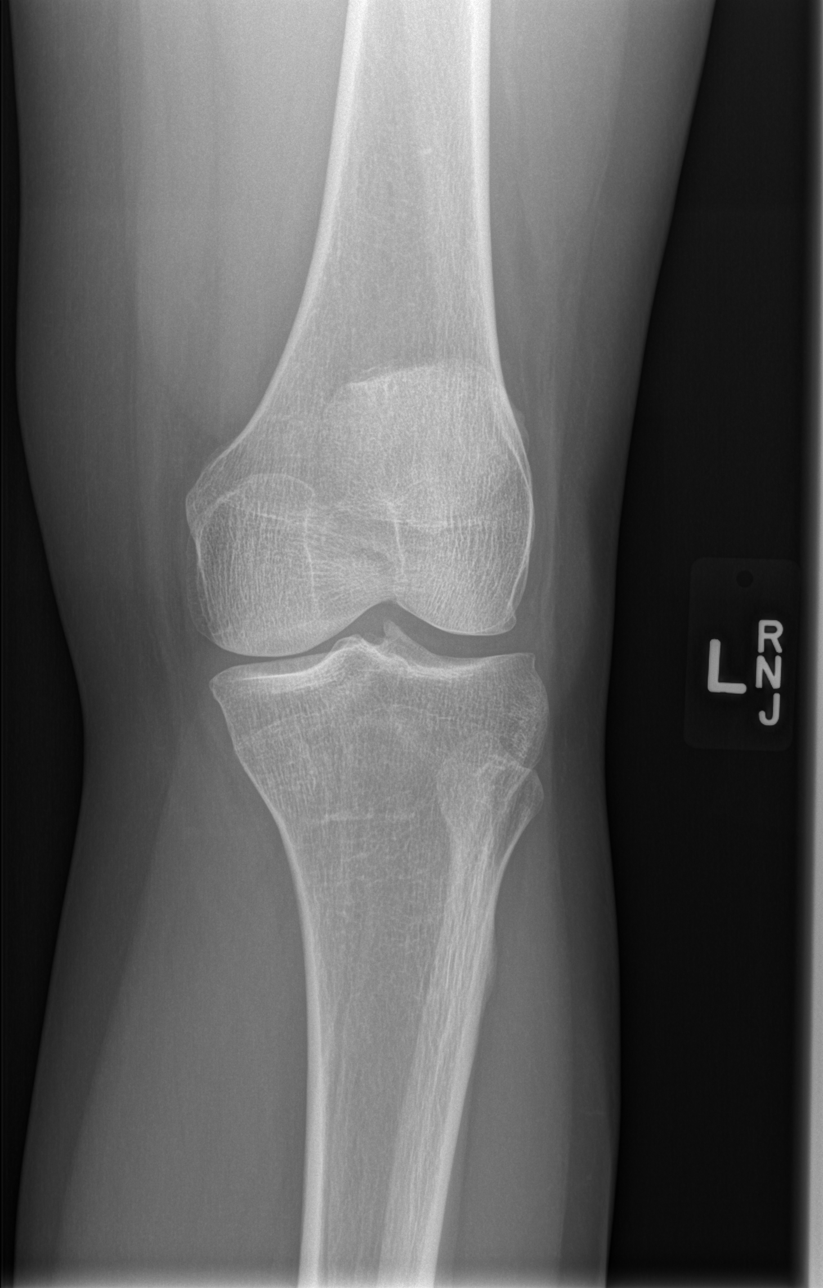

[t knee lat left (1 of 2)]
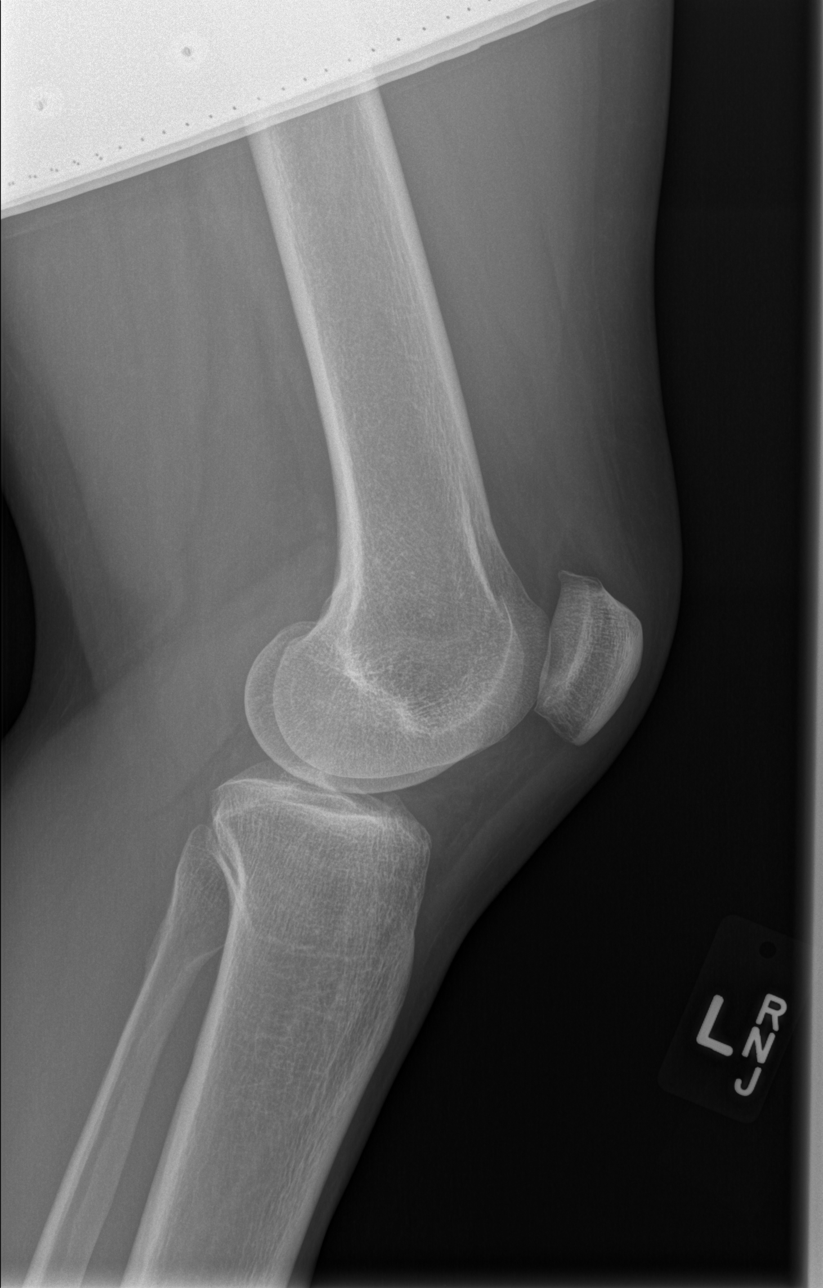

[t knee lat left (2 of 2)]
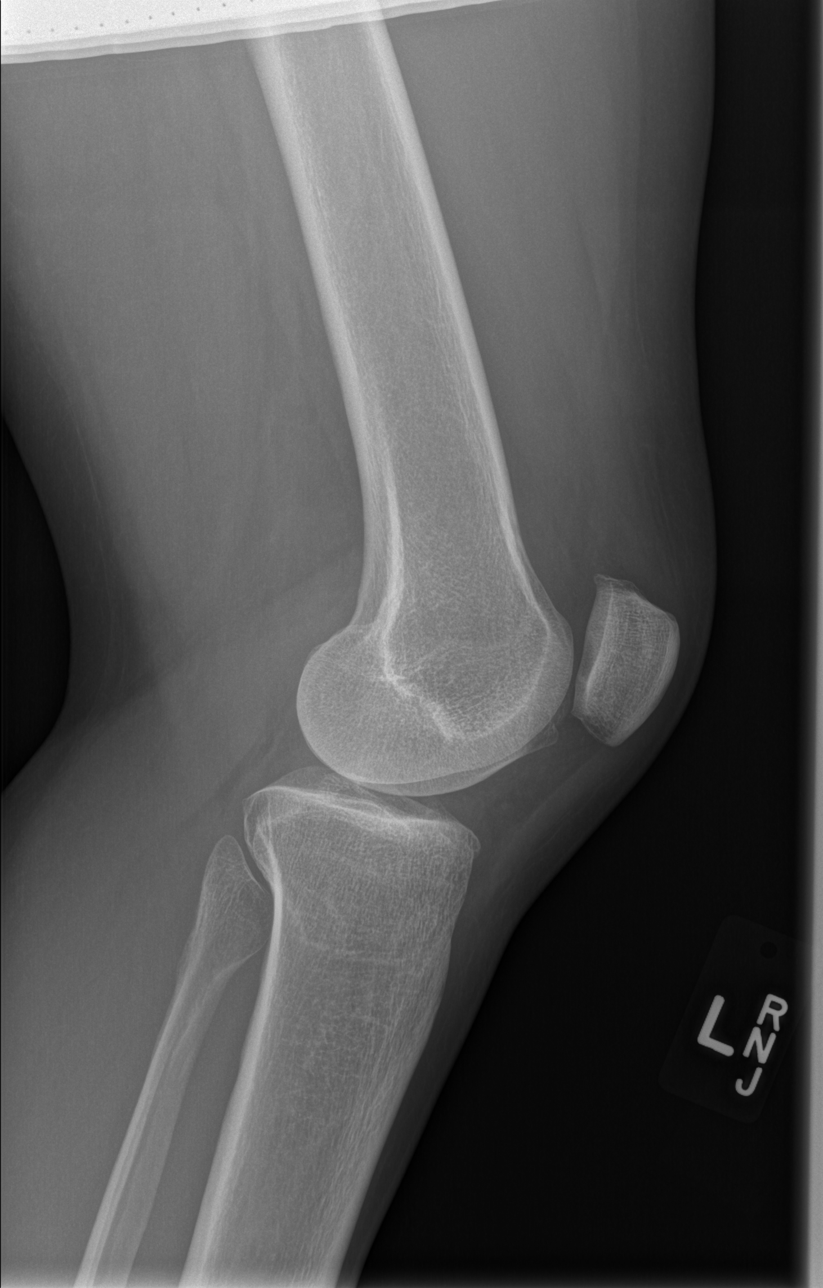

[5 of 5 positions shown; findings below may reference images not displayed]

FINDINGS: The knee is located. There is no significant effusion. Mild
tricompartmental degenerative changes are again noted. No acute
osseous abnormality is present. There is no radiopaque foreign body.
IMPRESSION: 1. Mild tricompartmental degenerative change.
2. No acute abnormality or focal soft tissue injury.

## 2016-06-14 DIAGNOSIS — G43719 Chronic migraine without aura, intractable, without status migrainosus: Secondary | ICD-10-CM | POA: Diagnosis not present

## 2016-06-14 DIAGNOSIS — G43019 Migraine without aura, intractable, without status migrainosus: Secondary | ICD-10-CM | POA: Diagnosis not present

## 2016-06-15 DIAGNOSIS — Z3042 Encounter for surveillance of injectable contraceptive: Secondary | ICD-10-CM | POA: Diagnosis not present

## 2016-08-15 DIAGNOSIS — Z3042 Encounter for surveillance of injectable contraceptive: Secondary | ICD-10-CM | POA: Diagnosis not present

## 2016-10-01 DIAGNOSIS — R05 Cough: Secondary | ICD-10-CM | POA: Diagnosis not present

## 2016-10-01 DIAGNOSIS — J0141 Acute recurrent pansinusitis: Secondary | ICD-10-CM | POA: Diagnosis not present

## 2016-10-14 DIAGNOSIS — Z3042 Encounter for surveillance of injectable contraceptive: Secondary | ICD-10-CM | POA: Diagnosis not present

## 2016-11-03 DIAGNOSIS — J209 Acute bronchitis, unspecified: Secondary | ICD-10-CM | POA: Diagnosis not present

## 2016-11-03 DIAGNOSIS — J0140 Acute pansinusitis, unspecified: Secondary | ICD-10-CM | POA: Diagnosis not present

## 2016-11-04 DIAGNOSIS — G43719 Chronic migraine without aura, intractable, without status migrainosus: Secondary | ICD-10-CM | POA: Diagnosis not present

## 2016-11-04 DIAGNOSIS — G43019 Migraine without aura, intractable, without status migrainosus: Secondary | ICD-10-CM | POA: Diagnosis not present

## 2016-11-22 DIAGNOSIS — G43909 Migraine, unspecified, not intractable, without status migrainosus: Secondary | ICD-10-CM | POA: Insufficient documentation

## 2016-12-01 DIAGNOSIS — Z113 Encounter for screening for infections with a predominantly sexual mode of transmission: Secondary | ICD-10-CM | POA: Diagnosis not present

## 2016-12-01 DIAGNOSIS — Z01419 Encounter for gynecological examination (general) (routine) without abnormal findings: Secondary | ICD-10-CM | POA: Diagnosis not present

## 2016-12-01 DIAGNOSIS — Z1151 Encounter for screening for human papillomavirus (HPV): Secondary | ICD-10-CM | POA: Diagnosis not present

## 2016-12-01 DIAGNOSIS — Z6825 Body mass index (BMI) 25.0-25.9, adult: Secondary | ICD-10-CM | POA: Diagnosis not present

## 2016-12-01 LAB — HM PAP SMEAR: HM PAP: NEGATIVE

## 2016-12-12 DIAGNOSIS — Z3042 Encounter for surveillance of injectable contraceptive: Secondary | ICD-10-CM | POA: Diagnosis not present

## 2016-12-30 DIAGNOSIS — H6123 Impacted cerumen, bilateral: Secondary | ICD-10-CM | POA: Diagnosis not present

## 2016-12-30 DIAGNOSIS — J342 Deviated nasal septum: Secondary | ICD-10-CM | POA: Diagnosis not present

## 2016-12-30 DIAGNOSIS — J320 Chronic maxillary sinusitis: Secondary | ICD-10-CM | POA: Diagnosis not present

## 2016-12-30 DIAGNOSIS — J322 Chronic ethmoidal sinusitis: Secondary | ICD-10-CM | POA: Diagnosis not present

## 2017-02-07 DIAGNOSIS — Z3040 Encounter for surveillance of contraceptives, unspecified: Secondary | ICD-10-CM | POA: Diagnosis not present

## 2017-04-04 DIAGNOSIS — Z1231 Encounter for screening mammogram for malignant neoplasm of breast: Secondary | ICD-10-CM | POA: Diagnosis not present

## 2017-04-04 DIAGNOSIS — Z3042 Encounter for surveillance of injectable contraceptive: Secondary | ICD-10-CM | POA: Diagnosis not present

## 2017-04-04 LAB — HM MAMMOGRAPHY

## 2017-04-06 DIAGNOSIS — G43719 Chronic migraine without aura, intractable, without status migrainosus: Secondary | ICD-10-CM | POA: Diagnosis not present

## 2017-04-06 DIAGNOSIS — G43019 Migraine without aura, intractable, without status migrainosus: Secondary | ICD-10-CM | POA: Diagnosis not present

## 2017-05-31 DIAGNOSIS — Z3042 Encounter for surveillance of injectable contraceptive: Secondary | ICD-10-CM | POA: Diagnosis not present

## 2017-07-11 DIAGNOSIS — G43719 Chronic migraine without aura, intractable, without status migrainosus: Secondary | ICD-10-CM | POA: Diagnosis not present

## 2017-07-11 DIAGNOSIS — G43019 Migraine without aura, intractable, without status migrainosus: Secondary | ICD-10-CM | POA: Diagnosis not present

## 2017-07-20 ENCOUNTER — Encounter: Payer: Self-pay | Admitting: Physician Assistant

## 2017-07-20 ENCOUNTER — Ambulatory Visit (INDEPENDENT_AMBULATORY_CARE_PROVIDER_SITE_OTHER): Payer: Federal, State, Local not specified - PPO | Admitting: Physician Assistant

## 2017-07-20 ENCOUNTER — Telehealth: Payer: Self-pay | Admitting: *Deleted

## 2017-07-20 VITALS — BP 162/102 | HR 95 | Temp 99.3°F | Ht 68.0 in | Wt 162.4 lb

## 2017-07-20 DIAGNOSIS — R030 Elevated blood-pressure reading, without diagnosis of hypertension: Secondary | ICD-10-CM | POA: Diagnosis not present

## 2017-07-20 DIAGNOSIS — G43819 Other migraine, intractable, without status migrainosus: Secondary | ICD-10-CM | POA: Diagnosis not present

## 2017-07-20 MED ORDER — KETOROLAC TROMETHAMINE 60 MG/2ML IM SOLN
60.0000 mg | Freq: Once | INTRAMUSCULAR | Status: AC
  Administered 2017-07-20: 30 mg via INTRAMUSCULAR

## 2017-07-20 NOTE — Patient Instructions (Addendum)
It was great to meet you!  Keep an eye on your blood pressure. If it consistently remains elevated, please let us know. If you develop any stroke like symptoms, please go to the emergency room!   Migraine Headache A migraine headache is an intense, throbbing pain on one side or both sides of the head. Migraines may also cause other symptoms, such as nausea, vomiting, and sensitivity to light and noise. What are the causes? Doing or taking certain things may also trigger migraines, such as:  Alcohol.  Smoking.  Medicines, such as: ? Medicine used to treat chest pain (nitroglycerine). ? Birth control pills. ? Estrogen pills. ? Certain blood pressure medicines.  Aged cheeses, chocolate, or caffeine.  Foods or drinks that contain nitrates, glutamate, aspartame, or tyramine.  Physical activity.  Other things that may trigger a migraine include:  Menstruation.  Pregnancy.  Hunger.  Stress, lack of sleep, too much sleep, or fatigue.  Weather changes.  What increases the risk? The following factors may make you more likely to experience migraine headaches:  Age. Risk increases with age.  Family history of migraine headaches.  Being Caucasian.  Depression and anxiety.  Obesity.  Being a woman.  Having a hole in the heart (patent foramen ovale) or other heart problems.  What are the signs or symptoms? The main symptom of this condition is pulsating or throbbing pain. Pain may:  Happen in any area of the head, such as on one side or both sides.  Interfere with daily activities.  Get worse with physical activity.  Get worse with exposure to bright lights or loud noises.  Other symptoms may include:  Nausea.  Vomiting.  Dizziness.  General sensitivity to bright lights, loud noises, or smells.  Before you get a migraine, you may get warning signs that a migraine is developing (aura). An aura may include:  Seeing flashing lights or having blind  spots.  Seeing bright spots, halos, or zigzag lines.  Having tunnel vision or blurred vision.  Having numbness or a tingling feeling.  Having trouble talking.  Having muscle weakness.  How is this diagnosed? A migraine headache can be diagnosed based on:  Your symptoms.  A physical exam.  Tests, such as CT scan or MRI of the head. These imaging tests can help rule out other causes of headaches.  Taking fluid from the spine (lumbar puncture) and analyzing it (cerebrospinal fluid analysis, or CSF analysis).  How is this treated? A migraine headache is usually treated with medicines that:  Relieve pain.  Relieve nausea.  Prevent migraines from coming back.  Treatment may also include:  Acupuncture.  Lifestyle changes like avoiding foods that trigger migraines.  Follow these instructions at home: Medicines  Take over-the-counter and prescription medicines only as told by your health care provider.  Do not drive or use heavy machinery while taking prescription pain medicine.  To prevent or treat constipation while you are taking prescription pain medicine, your health care provider may recommend that you: ? Drink enough fluid to keep your urine clear or pale yellow. ? Take over-the-counter or prescription medicines. ? Eat foods that are high in fiber, such as fresh fruits and vegetables, whole grains, and beans. ? Limit foods that are high in fat and processed sugars, such as fried and sweet foods. Lifestyle  Avoid alcohol use.  Do not use any products that contain nicotine or tobacco, such as cigarettes and e-cigarettes. If you need help quitting, ask your health care provider.  Get at least 8 hours of sleep every night.  Limit your stress. General instructions   Keep a journal to find out what may trigger your migraine headaches. For example, write down: ? What you eat and drink. ? How much sleep you get. ? Any change to your diet or medicines.  If you  have a migraine: ? Avoid things that make your symptoms worse, such as bright lights. ? It may help to lie down in a dark, quiet room. ? Do not drive or use heavy machinery. ? Ask your health care provider what activities are safe for you while you are experiencing symptoms.  Keep all follow-up visits as told by your health care provider. This is important. Contact a health care provider if:  You develop symptoms that are different or more severe than your usual migraine symptoms. Get help right away if:  Your migraine becomes severe.  You have a fever.  You have a stiff neck.  You have vision loss.  Your muscles feel weak or like you cannot control them.  You start to lose your balance often.  You develop trouble walking.  You faint. This information is not intended to replace advice given to you by your health care provider. Make sure you discuss any questions you have with your health care provider. Document Released: 11/21/2005 Document Revised: 06/10/2016 Document Reviewed: 05/09/2016 Elsevier Interactive Patient Education  2017 [employer].

## 2017-07-20 NOTE — Telephone Encounter (Signed)
Patient called office to schedule establish appointment. During calling, patient stated her migraines make her blood pressure go up. Called patient to get further information. Patient stated she sees Neurologist for her migraines since 2004. Patient takes Maxalt 10 mg PRN. Patient said she took two tablets yesterday and was advised to not take any today. Patient says her migraines are manageable and that her BP yesterday was 164/108. After taking Maxalt, her blood pressure dropped to 146/98. Patient further explained her current migraine symptoms are "Very normal" for her, she is under stress which triggered them yesterday. She usually sits in a quite dark place until they get better. Patient denies blurred vision, change in feeling or strength in upper extremities, and change to her smile. Per patient, she is not on blood pressure medications because her blood pressure rises only when her migraines trigger. She was previously receiving trigger point injections but stopped them last year.   Patient educated if new symptoms arise or migraines start again today to go to the ED and we will cancel her appointment. She verbally understood. We will see her at 1:30pm.

## 2017-07-20 NOTE — Progress Notes (Addendum)
Catherine Cox is a 41 y.o. female here for a recurrence of a previously resolved problem.   History of Present Illness:   Chief Complaint  Patient presents with  . Establish Care  . Migraine    high bp   . Sinus Problem    X3days     HPI  Migraines and Elevated Blood Pressure Has a long history of migraines since 2004. She is managed by the [facility] here in [place]. She took 2 Maxalt on Tuesday, nothing yesterday, and Flexeril today. Has nausea and photosensitivity. Lack of sleep, lack of rest -- caused her symptoms. Drinking plenty of water. Blood pressure only elevates with migraines. She checks her blood pressures regularly and she typically gets readings <191 systolic and <66 diastolic. BP yesterday was 164/98. Her neurologist follows her closely for this and does regular EKGs and labwork -- we are requesting this records. Typically gets 1 migraine a month.  Was on a preventative Migraine medication, but has been off of this for at least 1 year. Was also getting trigger point injections, last year -- was very effective but insurance was not paying for this so she discontinued these and is hoping to resume them next month. She denies chest pain, SOB, blurred vision, HA, swelling in bilateral legs. She is also due for her next depo-provera within in the next month. HA is currently mostly resolved but is still having pain, she rates a 6/10.  Denies any significant hx of anxiety or depression.  Past Medical History:  Diagnosis Date  . Environmental allergies   . Migraine headache    [facility] -- Dr. Domingo Cocking, has been going since 2004     Social History   Social History  . Marital status: Single    Spouse name: N/A  . Number of children: N/A  . Years of education: N/A   Occupational History  . Not on file.   Social History Main Topics  . Smoking status: Never Smoker  . Smokeless tobacco: Never Used  . Alcohol use Yes     Comment: occ  .  Drug use: No  . Sexual activity: Yes    Birth control/ protection: Injection   Other Topics Concern  . Not on file   Social History Narrative   Research scientist (medical) with [employer]   2 chlidren - 18 and 43 y/o   Single    Past Surgical History:  Procedure Laterality Date  . TONSILLECTOMY      Family History  Problem Relation Age of Onset  . Alzheimer's disease Mother   . Cancer Father        lymphoma and lung  . Breast cancer Neg Hx   . Colon cancer Neg Hx     Allergies  Allergen Reactions  . Codeine Anaphylaxis    Current Medications:   Current Outpatient Prescriptions:  .  cyclobenzaprine (FLEXERIL) 10 MG tablet, Take 10 mg by mouth 3 (three) times daily as needed for muscle spasms., Disp: , Rfl:  .  medroxyPROGESTERone (DEPO-PROVERA) 150 MG/ML injection, Inject 150 mg into the muscle every 3 (three) months., Disp: , Rfl:  .  rizatriptan (MAXALT) 10 MG tablet, Take 10 mg by mouth as needed for migraine. May repeat in 2 hours if needed, Disp: , Rfl:    Review of Systems:   Review of Systems  Constitutional: Negative for chills, fever, malaise/fatigue and weight loss.  Respiratory: Negative for cough, sputum production and shortness of breath.  Cardiovascular: Negative for chest pain, palpitations, claudication and leg swelling.  Gastrointestinal: Negative for abdominal pain, heartburn, nausea and vomiting.  Neurological: Positive for headaches. Negative for dizziness, tingling, tremors, sensory change, speech change, focal weakness, seizures and loss of consciousness.  Endo/Heme/Allergies: Negative for environmental allergies. Does not bruise/bleed easily.  Psychiatric/Behavioral: Negative for depression. The patient is not nervous/anxious.     Vitals:   Vitals:   07/20/17 1316  BP: (!) 162/102  Pulse: 95  Temp: 99.3 F (37.4 C)  TempSrc: Oral  SpO2: 96%  Weight: 162 lb 6.4 oz (73.7 kg)  Height: 5' 8"  (1.727 m)     Body mass index is 24.69  kg/m.  Physical Exam:   Physical Exam  Constitutional: She appears well-developed. She is cooperative.  Non-toxic appearance. She does not have a sickly appearance. She does not appear ill. No distress.  Cardiovascular: Normal rate, regular rhythm, S1 normal, S2 normal, normal heart sounds and normal pulses.   No LE edema  Pulmonary/Chest: Effort normal and breath sounds normal.  Neurological: She is alert. She has normal strength. No cranial nerve deficit or sensory deficit. She displays a negative Romberg sign. GCS eye subscore is 4. GCS verbal subscore is 5. GCS motor subscore is 6.  Skin: Skin is warm, dry and intact.  Psychiatric: She has a normal mood and affect. Her speech is normal and behavior is normal.  Nursing note and vitals reviewed.   Assessment and Plan:    Brownie was seen today for establish care, migraine and sinus problem.  Diagnoses and all orders for this visit:  Other migraine without status migrainosus, intractable Patient told to follow-up with neurology if symptoms persist. Received toradol injection in office and tolerated well. Discussed and reviewed stroke signs, advised that she go to the ER if she develops any - she is well versed in these symptoms and is agreeable to this plan. -     ketorolac (TORADOL) injection 60 mg; Inject 2 mLs (60 mg total) into the muscle once.  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 She has a blood pressure monitor at home. Currently not on any anti-hypertensives. Continue to monitor BP at home, follow-up with [place] if numbers do not improve or elevated numbers persist. Go to ER if chest pain, SOB, severe HA or other stroke symptoms.  . Reviewed expectations re: course of current medical issues. . Discussed self-management of symptoms. . Outlined signs and symptoms indicating need for more acute intervention. . Patient verbalized understanding and all questions  were answered. . See orders for this visit as documented in the electronic medical record. . Patient received an After-Visit Summary.   [name], PA-C

## 2017-07-25 ENCOUNTER — Other Ambulatory Visit: Payer: Self-pay | Admitting: Emergency Medicine

## 2017-07-25 ENCOUNTER — Telehealth: Payer: Self-pay | Admitting: Physician Assistant

## 2017-07-25 MED ORDER — ALBUTEROL SULFATE HFA 108 (90 BASE) MCG/ACT IN AERS: 2.0000 | INHALATION_SPRAY | RESPIRATORY_TRACT | 1 refills | Status: DC | PRN

## 2017-07-25 MED ORDER — AMOXICILLIN-POT CLAVULANATE 875-125 MG PO TABS: 1.0000 | ORAL_TABLET | Freq: Two times a day (BID) | ORAL | 0 refills | Status: DC

## 2017-07-25 NOTE — Telephone Encounter (Addendum)
ROI faxed to [facility] Dr. Ernesto Rutherford

## 2017-07-25 NOTE — Telephone Encounter (Signed)
Please Advise

## 2017-07-25 NOTE — Telephone Encounter (Signed)
Patient aware.

## 2017-07-25 NOTE — Telephone Encounter (Signed)
MEDICATION: INHALER AND ANTIBIOTIC FROM LAST VISIT AS DISCUSSED  PHARMACY: [id number] - [place], [place] - [address] AT [place] [address]    IS THIS A 90 DAY SUPPLY : no  IS PATIENT OUT OF MEDICTAION: never recieved  IF NOT; HOW MUCH IS LEFT: n/a  LAST APPOINTMENT DATE:07/20/17  NEXT APPOINTMENT DATE:n/a  OTHER COMMENTS: Patient stated that "[name] told her to call when she needed the antibiotic if her issue did not clear."; patient was here on 8/16   **Let patient know to contact pharmacy at the end of the day to make sure medication is ready. **  ** Please notify patient to allow 48-72 hours to process**  **Encourage patient to contact the pharmacy for refills or they can request refills through [facility]**

## 2017-07-25 NOTE — Telephone Encounter (Signed)
Talked with [name] and she instructed me to call in Augmentin, and albuterol.

## 2017-07-26 DIAGNOSIS — Z3042 Encounter for surveillance of injectable contraceptive: Secondary | ICD-10-CM | POA: Diagnosis not present

## 2017-07-28 NOTE — Telephone Encounter (Signed)
Rec'd from [facility] forwarded 6 pages to [facility] PA

## 2017-08-01 ENCOUNTER — Encounter: Payer: Self-pay | Admitting: Physician Assistant

## 2017-08-01 DIAGNOSIS — J0191 Acute recurrent sinusitis, unspecified: Secondary | ICD-10-CM | POA: Insufficient documentation

## 2017-08-08 ENCOUNTER — Encounter: Payer: Self-pay | Admitting: Physician Assistant

## 2017-09-20 DIAGNOSIS — Z3042 Encounter for surveillance of injectable contraceptive: Secondary | ICD-10-CM | POA: Diagnosis not present

## 2017-09-28 ENCOUNTER — Telehealth: Payer: Self-pay | Admitting: Physician Assistant

## 2017-09-28 NOTE — Telephone Encounter (Signed)
Spoke to pt and advised per [name] OV required; offered appt but pt will contact office back to schedule

## 2017-09-28 NOTE — Telephone Encounter (Signed)
MEDICATION:  amoxicillin-clavulanate (AUGMENTIN) 875-125 MG tablet PHARMACY:   [id number] [place] - [address] [phone](Phone) [phone](Fax)     IS THIS A 90 DAY SUPPLY : N/A  IS PATIENT OUT OF MEDICATION: Y  IF NOT; HOW MUCH IS LEFT:   LAST APPOINTMENT DATE: 07/20/17  NEXT APPOINTMENT DATE:@Visit  date not found  OTHER COMMENTS:    **Let patient know to contact pharmacy at the end of the day to make sure medication is ready. **  ** Please notify patient to allow 48-72 hours to process**  **Encourage patient to contact the pharmacy for refills or they [facility]*

## 2017-09-29 ENCOUNTER — Ambulatory Visit (INDEPENDENT_AMBULATORY_CARE_PROVIDER_SITE_OTHER): Payer: Federal, State, Local not specified - PPO | Admitting: Physician Assistant

## 2017-09-29 ENCOUNTER — Encounter: Payer: Self-pay | Admitting: Physician Assistant

## 2017-09-29 VITALS — BP 144/82 | HR 88 | Temp 98.8°F | Wt 165.4 lb

## 2017-09-29 DIAGNOSIS — J0191 Acute recurrent sinusitis, unspecified: Secondary | ICD-10-CM

## 2017-09-29 MED ORDER — ALBUTEROL SULFATE HFA 108 (90 BASE) MCG/ACT IN AERS
2.0000 | INHALATION_SPRAY | Freq: Four times a day (QID) | RESPIRATORY_TRACT | 0 refills | Status: DC | PRN
Start: 2017-09-29 — End: 2017-10-30

## 2017-09-29 MED ORDER — DOXYCYCLINE HYCLATE 100 MG PO TABS: 100.0000 mg | ORAL_TABLET | Freq: Two times a day (BID) | ORAL | 0 refills | Status: DC

## 2017-09-29 NOTE — Patient Instructions (Signed)
It was great to see you!  Stop Afrin, you may use Flonase or nasal saline instead.  If you decide to start the antibiotic, take as directed, take with food to prevent stomach upset.  Push fluids. Continue Zyrtec. Follow-up if symptoms worsen or persist.   Use inhaler prn.

## 2017-09-29 NOTE — Progress Notes (Signed)
Catherine Cox is a 41 y.o. female here for a new problem.  History of Present Illness:   Chief Complaint  Patient presents with  . Nasal Congestion  . congestion in chest    HPI   Patient presents with nasal congestion and congestion in chest. 3 days ago. No hx of asthma. Gets yearly bronchitis. Takes Zyrtec daily. Sees Dr. Claria Cox, ENT. Low grade fever, subjective fever at home. Appetite is stable. No diarrhea. No sick contacts. Did not receive the flu shot.  Past Medical History:  Diagnosis Date  . Environmental allergies   . Migraine headache    [facility] -- Dr. Domingo Cox, has been going since 2004     Social History   Social History  . Marital status: Single    Spouse name: N/A  . Number of children: N/A  . Years of education: N/A   Occupational History  . Not on file.   Social History Main Topics  . Smoking status: Never Smoker  . Smokeless tobacco: Never Used  . Alcohol use Yes     Comment: occ  . Drug use: No  . Sexual activity: Yes    Birth control/ protection: Injection   Other Topics Concern  . Not on file   Social History Narrative   Research scientist (medical) with [employer]   2 chlidren - 53 and 43 y/o   Single    Past Surgical History:  Procedure Laterality Date  . TONSILLECTOMY      Family History  Problem Relation Age of Onset  . Alzheimer's disease Mother   . Cancer Father        lymphoma and lung  . Breast cancer Neg Hx   . Colon cancer Neg Hx     Allergies  Allergen Reactions  . Codeine Anaphylaxis    Current Medications:   Current Outpatient Prescriptions:  .  albuterol (PROVENTIL HFA;VENTOLIN HFA) 108 (90 Base) MCG/ACT inhaler, Inhale 2 puffs into the lungs every 6 (six) hours as needed for wheezing or shortness of breath., Disp: 1 Inhaler, Rfl: 0 .  cyclobenzaprine (FLEXERIL) 10 MG tablet, Take 10 mg by mouth 3 (three) times daily as needed for muscle spasms., Disp: , Rfl:  .  doxycycline (VIBRA-TABS) 100 MG tablet,  Take 1 tablet (100 mg total) by mouth 2 (two) times daily., Disp: 20 tablet, Rfl: 0 .  medroxyPROGESTERone (DEPO-PROVERA) 150 MG/ML injection, Inject 150 mg into the muscle every 3 (three) months., Disp: , Rfl:  .  rizatriptan (MAXALT) 10 MG tablet, Take 10 mg by mouth as needed for migraine. May repeat in 2 hours if needed, Disp: , Rfl:    Review of Systems:   ROS  See HPI, negative unless otherwise specified.  Vitals:   Vitals:   09/29/17 1256  BP: (!) 144/82  Pulse: 88  Temp: 98.8 F (37.1 C)  TempSrc: Oral  SpO2: 98%  Weight: 165 lb 6 oz (75 kg)     Body mass index is 25.15 kg/m.  Physical Exam:   Physical Exam  Constitutional: She appears well-developed. She is cooperative.  Non-toxic appearance. She does not have a sickly appearance. She does not appear ill. No distress.  HENT:  Head: Normocephalic and atraumatic.  Right Ear: Tympanic membrane, external ear and ear canal normal. Tympanic membrane is not erythematous, not retracted and not bulging.  Left Ear: Tympanic membrane, external ear and ear canal normal. Tympanic membrane is not erythematous, not retracted and not bulging.  Nose: Mucosal edema  and rhinorrhea present. Right sinus exhibits maxillary sinus tenderness. Right sinus exhibits no frontal sinus tenderness. Left sinus exhibits maxillary sinus tenderness. Left sinus exhibits no frontal sinus tenderness.  Mouth/Throat: Uvula is midline and mucous membranes are normal. Posterior oropharyngeal edema present. No posterior oropharyngeal erythema. Tonsils are 0 on the right. Tonsils are 0 on the left.  Eyes: Conjunctivae and lids are normal.  Neck: Trachea normal.  Cardiovascular: Normal rate, regular rhythm, S1 normal, S2 normal and normal heart sounds.   Pulmonary/Chest: Effort normal and breath sounds normal. She has no decreased breath sounds. She has no wheezes. She has no rhonchi. She has no rales.  Lymphadenopathy:    She has no cervical adenopathy.   Neurological: She is alert.  Skin: Skin is warm, dry and intact.  Psychiatric: She has a normal mood and affect. Her speech is normal and behavior is normal.  Nursing note and vitals reviewed.     Assessment and Plan:    Catherine Cox was seen today for nasal congestion and congestion in chest.  Diagnoses and all orders for this visit:  Acute recurrent sinusitis, unspecified location No red flags on exam. Provided safety net prescription of Doxycycline, and discussed that if she starts, to take as directed. Stop use of Afrin and switch to Flonase. Albuterol inhaler prn. Follow-up if symptoms worsen or persist.  Other orders -     doxycycline (VIBRA-TABS) 100 MG tablet; Take 1 tablet (100 mg total) by mouth 2 (two) times daily. -     albuterol (PROVENTIL HFA;VENTOLIN HFA) 108 (90 Base) MCG/ACT inhaler; Inhale 2 puffs into the lungs every 6 (six) hours as needed for wheezing or shortness of breath.    . Reviewed expectations re: course of current medical issues. . Discussed self-management of symptoms. . Outlined signs and symptoms indicating need for more acute intervention. . Patient verbalized understanding and all questions were answered. . See orders for this visit as documented in the electronic medical record. . Patient received an After-Visit Summary.  [name], PA-C

## 2017-10-02 ENCOUNTER — Telehealth: Payer: Self-pay | Admitting: Physician Assistant

## 2017-10-02 ENCOUNTER — Other Ambulatory Visit: Payer: Self-pay | Admitting: Physician Assistant

## 2017-10-02 MED ORDER — PREDNISONE 20 MG PO TABS: 40.0000 mg | ORAL_TABLET | Freq: Every day | ORAL | 0 refills | Status: DC

## 2017-10-02 NOTE — Telephone Encounter (Signed)
Please see message and advise 

## 2017-10-02 NOTE — Telephone Encounter (Signed)
Patient called in reference to appointment on 09/29/17 and patient stated she has been taking the antibiotic twice daily since her appointment and stated she needs something stronger. doxycycline (VIBRA-TABS) 100 MG tablet. Please call patient and advise. OK to leave message.

## 2017-10-02 NOTE — Telephone Encounter (Signed)
Spoke to pt, told her Catherine Cox said, it is too soon to change antibiotics or to see if the antibiotic is effective. I can send in oral steroids for her take (20 mg Prednisone BID x 5 days.) Alternatively, she call her ENT physician if she would like a second opinion. Pt verbalized understanding and said she can send in steroids. Told her okay Rx will be sent to pharmacy. Pt verbalized understanding.

## 2017-10-02 NOTE — Telephone Encounter (Signed)
[name] please send in Rx for steroids.

## 2017-10-02 NOTE — Telephone Encounter (Signed)
Prednisone has been sent in.  [name] PA-C

## 2017-10-02 NOTE — Telephone Encounter (Signed)
It is too soon to change antibiotics or to see if the antibiotic is effective.  I can send in oral steroids for her take (20 mg Prednisone BID x 5 days.) Alternatively, she call her ENT physician if she would like a second opinion.  [name] PA-C

## 2017-10-30 ENCOUNTER — Other Ambulatory Visit: Payer: Self-pay | Admitting: *Deleted

## 2017-10-30 MED ORDER — ALBUTEROL SULFATE HFA 108 (90 BASE) MCG/ACT IN AERS: 2.0000 | INHALATION_SPRAY | Freq: Four times a day (QID) | RESPIRATORY_TRACT | 1 refills | Status: DC | PRN

## 2017-11-15 DIAGNOSIS — Z3042 Encounter for surveillance of injectable contraceptive: Secondary | ICD-10-CM | POA: Diagnosis not present

## 2017-11-21 DIAGNOSIS — Z130 Encounter for screening for diseases of the blood and blood-forming organs and certain disorders involving the immune mechanism: Secondary | ICD-10-CM | POA: Diagnosis not present

## 2017-11-21 DIAGNOSIS — Z6824 Body mass index (BMI) 24.0-24.9, adult: Secondary | ICD-10-CM | POA: Diagnosis not present

## 2017-11-21 DIAGNOSIS — Z01419 Encounter for gynecological examination (general) (routine) without abnormal findings: Secondary | ICD-10-CM | POA: Diagnosis not present

## 2017-11-21 DIAGNOSIS — E559 Vitamin D deficiency, unspecified: Secondary | ICD-10-CM | POA: Diagnosis not present

## 2017-11-21 DIAGNOSIS — Z1151 Encounter for screening for human papillomavirus (HPV): Secondary | ICD-10-CM | POA: Diagnosis not present

## 2017-12-12 DIAGNOSIS — R51 Headache: Secondary | ICD-10-CM | POA: Diagnosis not present

## 2017-12-12 DIAGNOSIS — G518 Other disorders of facial nerve: Secondary | ICD-10-CM | POA: Diagnosis not present

## 2017-12-12 DIAGNOSIS — G43719 Chronic migraine without aura, intractable, without status migrainosus: Secondary | ICD-10-CM | POA: Diagnosis not present

## 2017-12-12 DIAGNOSIS — G43019 Migraine without aura, intractable, without status migrainosus: Secondary | ICD-10-CM | POA: Diagnosis not present

## 2017-12-12 DIAGNOSIS — M791 Myalgia, unspecified site: Secondary | ICD-10-CM | POA: Diagnosis not present

## 2017-12-12 DIAGNOSIS — M542 Cervicalgia: Secondary | ICD-10-CM | POA: Diagnosis not present

## 2018-01-09 DIAGNOSIS — Z3042 Encounter for surveillance of injectable contraceptive: Secondary | ICD-10-CM | POA: Diagnosis not present

## 2018-01-12 ENCOUNTER — Ambulatory Visit: Payer: Federal, State, Local not specified - PPO | Admitting: Family Medicine

## 2018-01-12 ENCOUNTER — Encounter: Payer: Self-pay | Admitting: Family Medicine

## 2018-01-12 VITALS — BP 128/74 | HR 108 | Temp 98.9°F | Ht 68.0 in | Wt 166.8 lb

## 2018-01-12 DIAGNOSIS — R030 Elevated blood-pressure reading, without diagnosis of hypertension: Secondary | ICD-10-CM

## 2018-01-12 NOTE — Patient Instructions (Signed)
Your blood pressure today looks good.  Your goal blood pressure is 140/90 or lower. It is normal for your blood pressure to spike in response to pain.  Please let us know if y [place] stands for "Dietary Approaches to Stop Hypertension." The DASH eating plan is a healthy eating plan that has been shown to reduce high blood pressure (hypertension). It may also reduce your risk for type 2 diabetes, heart disease, and stroke. The DASH eating plan may also help with weight loss. What are tips for following this plan? General guidelines  Avoid eating more than 2,300 mg (milligrams) of salt (sodium) a day. If you have hypertension, you may need to reduce your sodium intake to 1,500 mg a day.  Limit alcohol intake to no more than 1 drink a day for nonpregnant women and 2 drinks a day for men. One drink equals 12 oz of beer, 5 oz of wine, or 1 oz of hard liquor.  Work with your health care provider to maintain a healthy body weight or to lose weight. Ask what an ideal weight is for you.  Get at least 30 minutes of exercise that causes your heart to beat faster (aerobic exercise) most days of the week. Activities may include walking, swimming, or biking.  Work with your health care provider or diet and nutrition specialist (dietitian) to adjust your eating plan to your individual calorie needs. Reading food labels  Check food labels for the amount of sodium per serving. Choose foods with less than 5 percent of the Daily Value of sodium. Generally, foods with less than 300 mg of sodium per serving fit into this eating plan.  To find whole grains, look for the word "whole" as the first word in the ingredient list. Shopping  Buy products labeled as "low-sodium" or "no salt added."  Buy fresh foods. Avoid canned foods and premade or frozen meals. Cooking  Avoid adding salt when cooking. Use salt-free seasonings or herbs instead of table salt or sea salt. Check with your health care  provider or pharmacist before using salt substitutes.  Do not fry foods. Cook foods using healthy methods such as baking, boiling, grilling, and broiling instead.  Cook with heart-healthy oils, such as olive, canola, soybean, or sunflower oil. Meal planning   Eat a balanced diet that includes: ? 5 or more servings of fruits and vegetables each day. At each meal, try to fill half of your plate with fruits and vegetables. ? Up to 6-8 servings of whole grains each day. ? Less than 6 oz of lean meat, poultry, or fish each day. A 3-oz serving of meat is about the same size as a deck of cards. One egg equals 1 oz. ? 2 servings of low-fat dairy each day. ? A serving of nuts, seeds, or beans 5 times each week. ? Heart-healthy fats. Healthy fats called Omega-3 fatty acids are found in foods such as flaxseeds and coldwater fish, like sardines, salmon, and mackerel.  Limit how much you eat of the following: ? Canned or prepackaged foods. ? Food that is high in trans fat, such as fried foods. ? Food that is high in saturated fat, such as fatty meat. ? Sweets, desserts, sugary drinks, and other foods with added sugar. ? Full-fat dairy products.  Do not salt foods before eating.  Try to eat at least 2 vegetarian meals each week.  Eat more home-cooked food and less restaurant, buffet, and fast food.  When eating at  a restaurant, ask that your food be prepared with less salt or no salt, if possible. What foods are recommended? The items listed may not be a complete list. Talk with your dietitian about what dietary choices are best for you. Grains Whole-grain or whole-wheat bread. Whole-grain or whole-wheat pasta. Brown rice. [place]. Bulgur. Whole-grain and low-sodium cereals. Pita bread. Low-fat, low-sodium crackers. Whole-wheat flour tortillas. Vegetables Fresh or frozen vegetables (raw, steamed, roasted, or grilled). Low-sodium or reduced-sodium tomato and vegetable juice. Low-sodium or  reduced-sodium tomato sauce and tomato paste. Low-sodium or reduced-sodium canned vegetables. Fruits All fresh, dried, or frozen fruit. Canned fruit in natural juice (without added sugar). Meat and other protein foods Skinless chicken or [place]. Ground chicken or [place]. Pork with fat trimmed off. Fish and seafood. Egg whites. Dried beans, peas, or lentils. Unsalted nuts, nut butters, and seeds. Unsalted canned beans. Lean cuts of beef with fat trimmed off. Low-sodium, lean deli meat. Dairy Low-fat (1%) or fat-free (skim) milk. Fat-free, low-fat, or reduced-fat cheeses. Nonfat, low-sodium ricotta or cottage cheese. Low-fat or nonfat yogurt. Low-fat, low-sodium cheese. Fats and oils Soft margarine without trans fats. Vegetable oil. Low-fat, reduced-fat, or light mayonnaise and salad dressings (reduced-sodium). Canola, safflower, olive, soybean, and sunflower oils. Avocado. Seasoning and other foods Herbs. Spices. Seasoning mixes without salt. Unsalted popcorn and pretzels. Fat-free sweets. What foods are not recommended? The items listed may not be a complete list. Talk with your dietitian about what dietary choices are best for you. Grains Baked goods made with fat, such as croissants, muffins, or some breads. Dry pasta or rice meal packs. Vegetables Creamed or fried vegetables. Vegetables in a cheese sauce. Regular canned vegetables (not low-sodium or reduced-sodium). Regular canned tomato sauce and paste (not low-sodium or reduced-sodium). Regular tomato and vegetable juice (not low-sodium or reduced-sodium). Angie Fava. Olives. Fruits Canned fruit in a light or heavy syrup. Fried fruit. Fruit in cream or butter sauce. Meat and other protein foods Fatty cuts of meat. Ribs. Fried meat. Berniece Salines. Sausage. Bologna and other processed lunch meats. Salami. Fatback. Hotdogs. Bratwurst. Salted nuts and seeds. Canned beans with added salt. Canned or smoked fish. Whole eggs or egg yolks. Chicken or [place]  with skin. Dairy Whole or 2% milk, cream, and half-and-half. Whole or full-fat cream cheese. Whole-fat or sweetened yogurt. Full-fat cheese. Nondairy creamers. Whipped toppings. Processed cheese and cheese spreads. Fats and oils Butter. Stick margarine. Lard. Shortening. Ghee. Bacon fat. Tropical oils, such as coconut, palm kernel, or palm oil. Seasoning and other foods Salted popcorn and pretzels. Onion salt, garlic salt, seasoned salt, table salt, and sea salt. Worcestershire sauce. Tartar sauce. Barbecue sauce. Teriyaki sauce. Soy sauce, including reduced-sodium. Steak sauce. Canned and packaged gravies. Fish sauce. Oyster sauce. Cocktail sauce. Horseradish that you find on the shelf. Ketchup. Mustard. Meat flavorings and tenderizers. Bouillon cubes. Hot sauce and Tabasco sauce. Premade or packaged marinades. Premade or packaged taco seasonings. Relishes. Regular salad dressings. Where to find more information:  National Heart, Lung, and [place]: https://wilson-eaton.com/  American Heart Association: www.heart.org Summary  The DASH eating plan is a healthy eating plan that has been shown to reduce high blood pressure (hypertension). It may also reduce your risk for type 2 diabetes, heart disease, and stroke.  With the DASH eating plan, you should limit salt (sodium) intake to 2,300 mg a day. If you have hypertension, you may need to reduce your sodium intake to 1,500 mg a day.  When on the DASH eating plan, aim to eat  more fresh fruits and vegetables, whole grains, lean proteins, low-fat dairy, and heart-healthy fats.  Work with your health care provider or diet and nutrition specialist (dietitian) to adjust your eating plan to your individual calorie needs. This information is not intended to replace advice given to you by your health care provider. Make sure you discuss any questions you have with your health care provider. Document Released: 11/10/2011 Document Revised: 11/14/2016  Document Reviewed: 11/14/2016 Elsevier Interactive Patient Education  [employer]. our pressure is persistently elevated above 140/90.  Take care, Dr Jerline Pain

## 2018-01-12 NOTE — Progress Notes (Signed)
    Subjective:  Kenidee Cregan Huizar is a 42 y.o. female who presents today for same-day appointment with a chief complaint of elevated blood pressure reading.   HPI:  Elevated Blood Pressure Reading, New Issue Patient was at her OB/GYN office earlier this week and had blood pressure into the systolic 295M.  Her provider instructed her to follow-up with her PCP for further management.  She has checked her blood pressure at work over the last several days and is been in the 120s over 70s.  Patient does not have a history of hypertension.  She is not on any medications.  Thinks that her pressure was elevated at her OB/GYN office due to a migraine.  No chest pain or shortness of breath.  No weakness or numbness.  ROS: Per HPI  Objective:  Physical Exam: BP 128/74 (BP Location: Left Arm, Patient Position: Sitting, Cuff Size: Normal)   Pulse (!) 108   Temp 98.9 F (37.2 C) (Oral)   Ht 5' 8"  (1.727 m)   Wt 166 lb 12.8 oz (75.7 kg)   SpO2 99%   BMI 25.36 kg/m   Gen: NAD, resting comfortably  Assessment/Plan:  Elevated Blood Pressure Reading At goal today.  Discussed normal physiological response of pain in regards to blood pressure.  Given that her pressure has been in the 120s over 70s at work, we do not need to start medications today.  Advised continued home blood pressure monitoring with goal 140/90 or lower.  Also discussed lifestyle modifications including regular exercise and low-sodium diet.  Return precautions reviewed.  Algis Greenhouse. Jerline Pain, MD 01/12/2018 1:46 PM

## 2018-01-23 ENCOUNTER — Other Ambulatory Visit: Payer: Self-pay | Admitting: Physician Assistant

## 2018-03-06 ENCOUNTER — Telehealth: Payer: Self-pay | Admitting: *Deleted

## 2018-03-06 DIAGNOSIS — Z3042 Encounter for surveillance of injectable contraceptive: Secondary | ICD-10-CM | POA: Diagnosis not present

## 2018-03-06 NOTE — Telephone Encounter (Signed)
Spoke to pt, she just got home. Told pt I received call from [facility] regarding Blood pressure being elevated.Pt said yes due to Migraine and is due to have trigger point injections with Neurology to help with Migraines has an appt on the 15th. Asked pt if she can check her blood pressure? Pt said no but will have nurse at her job check it tomorrow morning and the rest of the week and send results to [facility]. Asked pt to please send results to [place] also and if blood pressure is 140/90 x 2 days to please make an appointment. Pt verbalized understanding.

## 2018-03-06 NOTE — Telephone Encounter (Signed)
Received call from Dale at [facility] regarding pt was in today for a Depo shot and blood pressure was checked and it was 160/110. Erasmo Downer said pt said she knew it would be high was having a Migraine at present. Erasmo Downer said pt said she was going to go home and take her Migraine medicine and lie down and she has an appt with Neurology next week for injection for her Migraines. Erasmo Downer also said pt had blood pressure checked at work this morning and it was 121/78. Erasmo Downer just wanted to let us know about pt's blood pressure. Told her okay will let PCP know thank you.

## 2018-03-09 NOTE — Telephone Encounter (Signed)
FYI

## 2018-03-09 NOTE — Telephone Encounter (Signed)
Pt called back in to provide BP readings for the week. Pt says that she has an apt with her neurologist on April 15th.    03/07/18--- 142/96 at 2:55pm   03/08/18--- 138/92 at 8:20a and at 3:25p 146/96  03/09/18-- 140/92 at 9:20  Pt says that she has noticed that when the weather changed the pollen affects her sinuses which causes her to have a headache. Pt says that she has noticed that when she have a headache her BP is higher.

## 2018-03-12 NOTE — Telephone Encounter (Signed)
Patient should schedule an appointment with her PCP soon to discuss treatment options.  Algis Greenhouse. Jerline Pain, MD 03/12/2018 9:09 AM

## 2018-03-12 NOTE — Telephone Encounter (Signed)
FYI

## 2018-03-12 NOTE — Telephone Encounter (Signed)
Please call pt and schedule follow up with [facility] for blood pressure.

## 2018-03-19 DIAGNOSIS — M542 Cervicalgia: Secondary | ICD-10-CM | POA: Diagnosis not present

## 2018-03-19 DIAGNOSIS — G518 Other disorders of facial nerve: Secondary | ICD-10-CM | POA: Diagnosis not present

## 2018-03-19 DIAGNOSIS — G43019 Migraine without aura, intractable, without status migrainosus: Secondary | ICD-10-CM | POA: Diagnosis not present

## 2018-03-19 DIAGNOSIS — G43719 Chronic migraine without aura, intractable, without status migrainosus: Secondary | ICD-10-CM | POA: Diagnosis not present

## 2018-03-19 DIAGNOSIS — M791 Myalgia, unspecified site: Secondary | ICD-10-CM | POA: Diagnosis not present

## 2018-03-27 NOTE — Telephone Encounter (Signed)
I contacted the patient and she stated that she went on Monday to get her trigger point injections and her blood pressure was checked twice. The first time was 130/90 and then it was 128/84. The patient stated she does not need an appointment any longer at this time but would monitor her blood pressure and let us know if anything changes.

## 2018-03-27 NOTE — Telephone Encounter (Signed)
Please see message. °

## 2018-04-09 DIAGNOSIS — E559 Vitamin D deficiency, unspecified: Secondary | ICD-10-CM | POA: Diagnosis not present

## 2018-04-17 ENCOUNTER — Other Ambulatory Visit: Payer: Self-pay | Admitting: Physician Assistant

## 2018-05-01 DIAGNOSIS — Z3042 Encounter for surveillance of injectable contraceptive: Secondary | ICD-10-CM | POA: Diagnosis not present

## 2018-05-01 DIAGNOSIS — Z1231 Encounter for screening mammogram for malignant neoplasm of breast: Secondary | ICD-10-CM | POA: Diagnosis not present

## 2018-05-01 LAB — HM MAMMOGRAPHY

## 2018-05-28 DIAGNOSIS — H6123 Impacted cerumen, bilateral: Secondary | ICD-10-CM | POA: Diagnosis not present

## 2018-05-28 DIAGNOSIS — J018 Other acute sinusitis: Secondary | ICD-10-CM | POA: Diagnosis not present

## 2018-06-26 DIAGNOSIS — G43719 Chronic migraine without aura, intractable, without status migrainosus: Secondary | ICD-10-CM | POA: Diagnosis not present

## 2018-06-26 DIAGNOSIS — R51 Headache: Secondary | ICD-10-CM | POA: Diagnosis not present

## 2018-06-26 DIAGNOSIS — M542 Cervicalgia: Secondary | ICD-10-CM | POA: Diagnosis not present

## 2018-06-26 DIAGNOSIS — G43019 Migraine without aura, intractable, without status migrainosus: Secondary | ICD-10-CM | POA: Diagnosis not present

## 2018-06-26 DIAGNOSIS — G518 Other disorders of facial nerve: Secondary | ICD-10-CM | POA: Diagnosis not present

## 2018-06-26 DIAGNOSIS — M791 Myalgia, unspecified site: Secondary | ICD-10-CM | POA: Diagnosis not present

## 2018-06-29 DIAGNOSIS — Z3042 Encounter for surveillance of injectable contraceptive: Secondary | ICD-10-CM | POA: Diagnosis not present

## 2018-07-02 ENCOUNTER — Encounter: Payer: Self-pay | Admitting: Physician Assistant

## 2018-07-02 ENCOUNTER — Ambulatory Visit: Payer: Federal, State, Local not specified - PPO | Admitting: Physician Assistant

## 2018-07-02 VITALS — BP 156/100 | HR 101 | Ht 68.0 in | Wt 155.0 lb

## 2018-07-02 DIAGNOSIS — G43909 Migraine, unspecified, not intractable, without status migrainosus: Secondary | ICD-10-CM

## 2018-07-02 DIAGNOSIS — R030 Elevated blood-pressure reading, without diagnosis of hypertension: Secondary | ICD-10-CM | POA: Diagnosis not present

## 2018-07-02 MED ORDER — PROMETHAZINE HCL 25 MG/ML IJ SOLN
25.0000 mg | Freq: Once | INTRAMUSCULAR | Status: AC
  Administered 2018-07-02: 25 mg via INTRAMUSCULAR

## 2018-07-02 MED ORDER — PROMETHAZINE HCL 25 MG PO TABS: 25.0000 mg | ORAL_TABLET | Freq: Four times a day (QID) | ORAL | 0 refills | Status: DC | PRN

## 2018-07-02 NOTE — Progress Notes (Addendum)
Catherine Cox is a 42 y.o. female here for a recurrent problem.   History of Present Illness:   Chief Complaint  Patient presents with  . Migraine    Migraine   This is a recurrent problem. The current episode started today (this morning around 10:30, took M). Pain location: Left side of head. The pain quality is similar to prior headaches. The quality of the pain is described as throbbing. The pain is at a severity of 8/10. The pain is severe. Associated symptoms include nausea. Pertinent negatives include no tingling, tinnitus or visual change. The symptoms are aggravated by bright light, activity and noise. Treatments tried: Maxalt 10 mg no relief. The treatment provided no relief. Her past medical history is significant for migraine headaches.   She received trigger point injection last week and also her depo injection. She has had relief in the past with phenergan and would like that today.  Denies: slurred speech, visual changes, changes in balance, weakness on one side of body, chest pain, SOB  Past Medical History:  Diagnosis Date  . Environmental allergies   . Migraine headache    [facility] Dr[facility] Dom[facility], has been going since 2004     Social History   Socioeconomic History  . Marital status: Single    Spouse name: Not on file  . Number of children: Not on file  . Years of education: Not on file  . Highest education level: Not on file  Occupational History  . Not on file  Social Needs  . Financial resource strain: Not on file  . Food insecurity:    Worry: Not on file    Inability: Not on file  . Transportation needs:    Medical: Not on file    Non-medical: Not on file  Tobacco Use  . Smoking status: Never Smoker  . Smokeless tobacco: Never Used  Substance and Sexual Activity  . Alcohol use: Yes    Comment: occ  . Drug use: No  . Sexual activity: Yes    Birth control/protection: Injection  Lifestyle  . Physical activity:    Days per week:  Not on file    Minutes per session: Not on file  . Stress: Not on file  Relationships  . Social connections:    Talks on phone: Not on file    Gets together: Not on file    Attends religious service: Not on file    Active member of club or organization: Not on file    Attends meetings of clubs or organizations: Not on file    Relationship status: Not on file  . Intimate partner violence:    Fear of current or ex partner: Not on file    Emotionally abused: Not on file    Physically abused: Not on file    Forced sexual activity: Not on file  Other Topics Concern  . 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 TONSILLECTOMY      Family History  Problem Relation Age of Onset  . Alzheimer's disease Mother   . Cancer Father        lymphoma and lung  . Breast cancer Neg Hx   . Colon cancer Neg Hx     Allergies  Allergen Reactions  . Codeine Anaphylaxis    Current Medications:  Current Outpatient Medications:  .  albuterol (PROVENTIL HFA;VENTOLIN HFA) 108 (90 Base) MCG/ACT inhaler, TAKE 2 PUFFS BY MOUTH EVERY 6 HOURS AS NEEDED FOR WHEEZE OR SHORTNESS OF BREATH, Disp: 8.5 Inhaler, Rfl: 1 .  cyclobenzaprine (FLEXERIL) 10 MG tablet, Take 10 mg by mouth 3 (three) times daily as needed for muscle spasms., Disp: , Rfl:  .  medroxyPROGESTERone (DEPO-PROVERA) 150 MG/ML injection, Inject 150 mg into the muscle. Every 8-10 weeks. , Disp: , Rfl:  .  predniSONE (DELTASONE) 20 MG tablet, Take 2 tablets (40 mg total) by mouth daily., Disp: 10 tablet, Rfl: 0 .  promethazine (PHENERGAN) 25 MG tablet, Take 1 tablet (25 mg total) by mouth every 6 (six) hours as needed for nausea or vomiting., Disp: 30 tablet, Rfl: 0 .  rizatriptan (MAXALT) 10 MG tablet, Take 10 mg by mouth as needed for migraine. May repeat in 2 hours if needed, Disp: , Rfl:    Review of Systems:    Review of Systems  HENT: Negative for tinnitus.   Gastrointestinal: Positive for nausea.  Neurological: Negative for tingling.  Negative unless otherwise specified per HPI.  Vitals:   Vitals:   07/02/18 1407  BP: (!) 156/100  Pulse: (!) 101  SpO2: 98%  Weight: 155 lb (70.3 kg)  Height: 5' 8"  (1.727 m)     Body mass index is 23.57 kg/m.  Physical Exam:   Physical Exam  Constitutional: She appears well-developed. She is cooperative.  Non-toxic appearance. She does not have a sickly appearance. She does not appear ill. No distress.  Cardiovascular: Normal rate, regular rhythm, S1 normal, S2 normal, normal heart sounds and normal pulses.  No LE edema  Pulmonary/Chest: Effort normal and breath sounds normal.  Neurological: She is alert. She has normal strength. No cranial nerve deficit or sensory deficit. She displays a negative Romberg sign. GCS eye subscore is 4. GCS verbal subscore is 5. GCS motor subscore is 6.  Skin: Skin is warm, dry and intact.  Psychiatric: She has a normal mood and affect. Her speech is normal and behavior is normal.  Tearful  Nursing note and vitals reviewed.     Assessment and Plan:    Brexlee was seen today for migraine.  Diagnoses and all orders for this visit:  Migraine without status migrainosus, not intractable, unspecified migraine type -     promethazine (PHENERGAN) injection 25 mg  Elevated blood pressure reading  Other orders -     promethazine (PHENERGAN) 25 MG tablet; Take 1 tablet (25 mg total) by mouth every 6 (six) hours as needed for nausea or vomiting.   No red flags on neuro exam. Provided phenergan injection in office, per patient request. Husband present to drive her home. I also provided oral prescription of phenergan should she need it. Discussed that if she developed any stroke-like symptoms, she needs to go to ER. If HA does not improve, needs to discuss with her neurologist at [facility].  I also recommended  that she follow-up with [place] in 2-4 weeks to re-evaluate her blood pressure.  . Reviewed expectations re: course of current medical issues. . Discussed self-management of symptoms. . Outlined signs and symptoms indicating need for more acute intervention. . Patient verbalized understanding and all questions were answered. . See orders for this visit as documented in the electronic medical record. . Patient received an After-Visit Summary.  CMA or LPN served as scribe during this visit. History, Physical, and Plan performed by medical provider. Documentation and  orders reviewed and attested to.   [name], PA-C

## 2018-07-02 NOTE — Patient Instructions (Addendum)
Push fluids.  May take phenergan every 6 hours as needed.  If your headache worsens in any way or does not improve, please seek medical attention.  If you develop any stroke like symptoms (weakness on one side of the body, slurred speech, changes in balance.)

## 2018-07-29 ENCOUNTER — Other Ambulatory Visit: Payer: Self-pay | Admitting: Physician Assistant

## 2018-08-10 ENCOUNTER — Ambulatory Visit (INDEPENDENT_AMBULATORY_CARE_PROVIDER_SITE_OTHER): Payer: Federal, State, Local not specified - PPO | Admitting: Physician Assistant

## 2018-08-10 ENCOUNTER — Encounter: Payer: Self-pay | Admitting: Physician Assistant

## 2018-08-10 VITALS — BP 144/98 | HR 94 | Temp 99.6°F | Ht 68.0 in | Wt 156.4 lb

## 2018-08-10 DIAGNOSIS — G43909 Migraine, unspecified, not intractable, without status migrainosus: Secondary | ICD-10-CM

## 2018-08-10 DIAGNOSIS — R110 Nausea: Secondary | ICD-10-CM

## 2018-08-10 MED ORDER — AMOXICILLIN-POT CLAVULANATE 400-57 MG/5ML PO SUSR: 800.0000 mg | Freq: Two times a day (BID) | ORAL | 0 refills | Status: DC

## 2018-08-10 MED ORDER — KETOROLAC TROMETHAMINE 60 MG/2ML IM SOLN
60.0000 mg | Freq: Once | INTRAMUSCULAR | Status: AC
  Administered 2018-08-10: 60 mg via INTRAMUSCULAR

## 2018-08-10 MED ORDER — ONDANSETRON HCL 4 MG/2ML IJ SOLN
4.0000 mg | Freq: Once | INTRAMUSCULAR | Status: AC
  Administered 2018-08-10: 4 mg via INTRAMUSCULAR

## 2018-08-10 MED ORDER — ONDANSETRON HCL 4 MG/5ML PO SOLN: 4.0000 mg | Freq: Once | ORAL | Status: DC

## 2018-08-10 NOTE — Patient Instructions (Signed)
Please increase your fluid intake. Hydration is key!  Get plenty of rest.   If you feel up to eating solid foods, try to follow the dietary guide at the bottom of your After Visit Summary. These foods are gentler on the stomach.  If you are unable to keep fluids down by mouth, or if symptoms acutely worsen, please go to the ER as you may need IV fluids.      Bland Diet A bland diet consists of foods that do not have a lot of fat or fiber. Foods without fat or fiber are easier for the body to digest. They are also less likely to irritate your mouth, throat, stomach, and other parts of your gastrointestinal tract. A bland diet is sometimes called a BRAT diet. What is my plan? Your health care provider or dietitian may recommend specific changes to your diet to prevent and treat your symptoms, such as:  Eating small meals often.  Cooking food until it is soft enough to chew easily.  Chewing your food well.  Drinking fluids slowly.  Not eating foods that are very spicy, sour, or fatty.  Not eating citrus fruits, such as oranges and grapefruit.  What do I need to know about this diet?  Eat a variety of foods from the bland diet food list.  Do not follow a bland diet longer than you have to.  Ask your health care provider whether you should take vitamins. What foods can I eat? Grains  Hot cereals, such as cream of wheat. Bread, crackers, or tortillas made from refined white flour. Rice. Vegetables Canned or cooked vegetables. Mashed or boiled potatoes. Fruits Bananas. Applesauce. Other types of cooked or canned fruit with the skin and seeds removed, such as canned peaches or pears. Meats and Other Protein Sources Scrambled eggs. Creamy peanut butter or other nut butters. Lean, well-cooked meats, such as chicken or fish. Tofu. Soups or broths. Dairy Low-fat dairy products, such as milk, cottage cheese, or yogurt. Beverages Water. Herbal tea. Apple juice. Sweets and  Desserts Pudding. Custard. Fruit gelatin. Ice cream. Fats and Oils Mild salad dressings. Canola or olive oil. The items listed above may not be a complete list of allowed foods or beverages. Contact your dietitian for more options. What foods are not recommended? Foods and ingredients that are often not recommended include:  Spicy foods, such as hot sauce or salsa.  Fried foods.  Sour foods, such as pickled or fermented foods.  Raw vegetables or fruits, especially citrus or berries.  Caffeinated drinks.  Alcohol.  Strongly flavored seasonings or condiments.  The items listed above may not be a complete list of foods and beverages that are not allowed. Contact your dietitian for more information. This information is not intended to replace advice given to you by your health care provider. Make sure you discuss any questions you have with your health care provider. Document Released: 03/14/2016 Document Revised: 04/28/2016 Document Reviewed: 12/03/2014 Elsevier Interactive Patient Education  2018 [employer].

## 2018-08-10 NOTE — Progress Notes (Signed)
Catherine Cox is a 42 y.o. female here for a new problem.  SCRIBE STATEMENT I, Catherine Cox, acting as a Education administrator for [employer], PA.,have documented all relevant documentation on the behalf of Catherine Coke, PA,as directed by  Catherine Coke, PA while in the presence of Catherine Cox, [place].  History of Present Illness:   Chief Complaint  Patient presents with  . Nausea    x 2 days   . Headache    HPI   Patient in office for evaluation for nausea and vomiting. Started yesterday while at work. She broke out in a hot flash then started having nausea. She only vomited yesterday. Today she is only having nausea. She is having a lot of sinus drainage, slight cough and sore throat. She did have migraine last night she treated with maxalt. It has improved but still having dull headache. She denies any sensitivity to light or sound. Patient reports that she had a low grade fever <100 degrees last night. She is drinking water.   No LMP recorded. Patient has had an injection. She gets depo-provera injections.   Past Medical History:  Diagnosis Date  . Environmental allergies   . Migraine headache    [facility] -- Dr. Dom[facility], has been going since 2004     Social History   Socioeconomic History  . Marital status: Single    Spouse name: Not on file  . Number of children: Not on file  . Years of education: Not on file  . Highest education level: Not on file  Occupational History  . Not on file  Social Needs  . Financial resource strain: Not on file  . Food insecurity:    Worry: Not on file    Inability: Not on file  . Transportation needs:    Medical: Not on file    Non-medical: Not on file  Tobacco Use  . Smoking status: Never Smoker  . Smokeless tobacco: Never Used  Substance and Sexual Activity  . Alcohol use: Yes    Comment: occ  . Drug use: No  . Sexual activity: Yes    Birth control/protection: Injection  Lifestyle  . Physical activity:    Days  per week: Not on file    Minutes per session: Not on file  . Stress: Not on file  Relationships  . Social connections:    Talks on phone: Not on file    Gets together: Not on file    Attends religious service: Not on file    Active member of club or organization: Not on file    Attends meetings of clubs or organizations: Not on file    Relationship status: Not on file  . Intimate partner violence:    Fear of current or ex partner: Not on file    Emotionally abused: Not on file    Physically abused: Not on file    Forced sexual activity: Not on file  Other Topics Concern  . 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 TONSILLECTOMY      Family History  Problem Relation Age of Onset  . Alzheimer's disease Mother   . Cancer Father        lymphoma and lung  . Breast cancer Neg Hx   . Colon cancer Neg Hx  Allergies  Allergen Reactions  . Codeine Anaphylaxis    Current Medications:   Current Outpatient Medications:  .  albuterol (PROVENTIL HFA;VENTOLIN HFA) 108 (90 Base) MCG/ACT inhaler, TAKE 2 PUFFS BY MOUTH EVERY 6 HOURS AS NEEDED FOR WHEEZE OR SHORTNESS OF BREATH, Disp: 8.5 Inhaler, Rfl: 1 .  cyclobenzaprine (FLEXERIL) 10 MG tablet, Take 10 mg by mouth 3 (three) times daily as needed for muscle spasms., Disp: , Rfl:  .  medroxyPROGESTERone (DEPO-PROVERA) 150 MG/ML injection, Inject 150 mg into the muscle. Every 8-10 weeks. , Disp: , Rfl:  .  promethazine (PHENERGAN) 25 MG tablet, Take 1 tablet (25 mg total) by mouth every 6 (six) hours as needed for nausea or vomiting., Disp: 30 tablet, Rfl: 0 .  rizatriptan (MAXALT) 10 MG tablet, Take 10 mg by mouth as needed for migraine. May repeat in 2 hours if needed, Disp: , Rfl:  .  amoxicillin-clavulanate (AUGMENTIN) 400-57 MG/5ML suspension, Take 10 mLs (800 mg total) by mouth 2 (two) times daily.,  Disp: 100 mL, Rfl: 0   Review of Systems:   Review of Systems  Constitutional: Negative for chills and fever.  HENT: Positive for congestion and sinus pain. Negative for ear pain.   Eyes: Negative for blurred vision and double vision.  Gastrointestinal: Positive for nausea and vomiting.  Neurological: Positive for headaches.    Vitals:   Vitals:   08/10/18 1255  BP: (!) 144/98  Pulse: 94  Temp: 99.6 F (37.6 C)  TempSrc: Oral  SpO2: 96%  Weight: 156 lb 6.4 oz (70.9 kg)  Height: 5' 8"  (1.727 m)     Body mass index is 23.78 kg/m.  Physical Exam:   Physical Exam  Constitutional: She appears well-developed. She is cooperative.  Non-toxic appearance. She does not have a sickly appearance. She does not appear ill. No distress.  HENT:  Head: Normocephalic and atraumatic.  Right Ear: Tympanic membrane, external ear and ear canal normal. Tympanic membrane is not erythematous, not retracted and not bulging.  Left Ear: Tympanic membrane, external ear and ear canal normal. Tympanic membrane is not erythematous, not retracted and not bulging.  Nose: Right sinus exhibits maxillary sinus tenderness. Right sinus exhibits no frontal sinus tenderness. Left sinus exhibits maxillary sinus tenderness. Left sinus exhibits no frontal sinus tenderness.  Mouth/Throat: Uvula is midline. No posterior oropharyngeal edema or posterior oropharyngeal erythema.  Eyes: Conjunctivae and lids are normal.  Neck: Trachea normal.  Cardiovascular: Normal rate, regular rhythm, S1 normal, S2 normal and normal heart sounds.  Pulmonary/Chest: Effort normal and breath sounds normal. She has no decreased breath sounds. She has no wheezes. She has no rhonchi. She has no rales.  Lymphadenopathy:    She has no cervical adenopathy.  Neurological: She is alert. She has normal strength. No cranial nerve deficit or sensory deficit. Coordination and gait normal. GCS eye subscore is 4. GCS verbal subscore is 5. GCS motor  subscore is 6.  Reflex Scores:      Patellar reflexes are 2+ on the right side and 2+ on the left side. Skin: Skin is warm, dry and intact.  Psychiatric: She has a normal mood and affect. Her speech is normal and behavior is normal.  Nursing note and vitals reviewed.   Assessment and Plan:    Cerise was seen today for nausea and headache.  Diagnoses and all orders for this visit:  Migraine without status migrainosus, not intractable, unspecified migraine type; Nausea No red flags on exam. Neuro exam benign. Patient was  agreeable to toradol and zofran injections in the office today. Prior to injection patient was asked to provide urine sample for confirmation of negative pregnancy, however patient was unable to void -- she did complete AMA paperwork where she signed and confirmed her knowledge of risks of injection including harm or death to fetus. Possible early sinus infection --> provided her with safety net prescription of augmentin should her congestion worsen. Provided list of bland foods to work on bland diet. Discussed need for follow-up if symptoms persist. If symptoms worsen over the weekend, she was instructed to seek medical attention. -     ketorolac (TORADOL) injection 60 mg -     POCT urine pregnancy -     ondansetron (ZOFRAN) injection 4 mg  Other orders -     amoxicillin-clavulanate (AUGMENTIN) 400-57 MG/5ML suspension; Take 10 mLs (800 mg total) by mouth 2 (two) times daily.    . Reviewed expectations re: course of current medical issues. . Discussed self-management of symptoms. . Outlined signs and symptoms indicating need for more acute intervention. . Patient verbalized understanding and all questions were answered. . See orders for this visit as documented in the electronic medical record. . Patient received an After-Visit Summary.  CMA or LPN served as scribe during this visit. History, Physical, and Plan performed by medical provider. The above documentation has  been reviewed and is accurate and complete.   Catherine Coke, PA-C

## 2018-08-27 DIAGNOSIS — Z3042 Encounter for surveillance of injectable contraceptive: Secondary | ICD-10-CM | POA: Diagnosis not present

## 2018-09-07 ENCOUNTER — Encounter: Payer: Self-pay | Admitting: Physician Assistant

## 2018-09-07 ENCOUNTER — Ambulatory Visit (INDEPENDENT_AMBULATORY_CARE_PROVIDER_SITE_OTHER): Payer: Federal, State, Local not specified - PPO | Admitting: Physician Assistant

## 2018-09-07 VITALS — BP 158/100 | HR 89 | Ht 68.0 in | Wt 155.0 lb

## 2018-09-07 DIAGNOSIS — R030 Elevated blood-pressure reading, without diagnosis of hypertension: Secondary | ICD-10-CM

## 2018-09-07 NOTE — Patient Instructions (Signed)
It was great to see you!  Follow-up in 1 week. I will be in touch with your lab results.  If you develop any changes in symptoms, please seek medical attention.  Take care,  [name] PA-C

## 2018-09-07 NOTE — Progress Notes (Signed)
Catherine Cox is a 42 y.o. female is here to discuss: Elevated blood pressure.  I acted as a Education administrator for [employer], PA-C Catherine Pickler, LPN  History of Present Illness:   Chief Complaint  Patient presents with  . Hypertension    HPI  Elevated blood pressure Pt here for a follow up on blood pressure. Pt checking periodically last week was 138/74 at GYN office. Having headaches thinks due to weather change. Pt has been under a lot of stress lately. Patient denies chest pain, SOB, blurred vision, dizziness, unusual headaches, lower leg swelling. Denies excessive caffeine intake, stimulant usage, excessive alcohol intake, or increase in salt consumption.   BP Readings from Last 3 Encounters:  09/07/18 (!) 158/100  08/10/18 (!) 144/98  07/02/18 (!) 156/100     Health Maintenance Due  Topic Date Due  . MAMMOGRAM  04/04/2018    Past Medical History:  Diagnosis Date  . Environmental allergies   . Migraine headache    [facility] Dr[facility] Domingo Cox, has been going since 2004     Social History   Socioeconomic History  . Marital status: Single    Spouse name: Not on file  . Number of children: Not on file  . Years of education: Not on file  . Highest education level: Not on file  Occupational History  . Not on file  Social Needs  . Financial resource strain: Not on file  . Food insecurity:    Worry: Not on file    Inability: Not on file  . Transportation needs:    Medical: Not on file    Non-medical: Not on file  Tobacco Use  . Smoking status: Never Smoker  . Smokeless tobacco: Never Used  Substance and Sexual Activity  . Alcohol use: Yes    Comment: occ  . Drug use: No  . Sexual activity: Yes    Birth control/protection: Injection  Lifestyle  . Physical activity:    Days per week: Not on file    Minutes per session: Not on file  . Stress: Not on file  Relationships  . Social connections:    Talks on phone: Not on file    Gets together:  Not on file    Attends religious service: Not on file    Active member of club or organization: Not on file    Attends meetings of clubs or organizations: Not on file    Relationship status: Not on file  . Intimate partner violence:    Fear of current or ex partner: Not on file    Emotionally abused: Not on file    Physically abused: Not on file    Forced sexual activity: Not on file  Other Topics Concern  . 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 TONSILLECTOMY      Family History  Problem Relation Age of Onset  . Alzheimer's disease Mother   . Cancer Father        lymphoma and lung  . Breast cancer Neg Hx   . Colon cancer Neg Hx     PMHx, SurgHx, SocialHx, FamHx, Medications, and Allergies were reviewed in the Visit Navigator and updated as appropriate.   Patient Active Problem List   Diagnosis Date Noted  . Acute recurrent sinusitis 08/01/2017  . Migraine  11/22/2016    Social History   Tobacco Use  . Smoking status: Never Smoker  . Smokeless tobacco: Never Used  Substance Use Topics  . Alcohol use: Yes    Comment: occ  . Drug use: No    Current Medications and Allergies:    Current Outpatient Medications:  .  albuterol (PROVENTIL HFA;VENTOLIN HFA) 108 (90 Base) MCG/ACT inhaler, TAKE 2 PUFFS BY MOUTH EVERY 6 HOURS AS NEEDED FOR WHEEZE OR SHORTNESS OF BREATH, Disp: 8.5 Inhaler, Rfl: 1 .  cyclobenzaprine (FLEXERIL) 10 MG tablet, Take 10 mg by mouth 3 (three) times daily as needed for muscle spasms., Disp: , Rfl:  .  medroxyPROGESTERone (DEPO-PROVERA) 150 MG/ML injection, Inject 150 mg into the muscle. Every 8-10 weeks. , Disp: , Rfl:  .  promethazine (PHENERGAN) 25 MG tablet, Take 1 tablet (25 mg total) by mouth every 6 (six) hours as needed for nausea or vomiting., Disp: 30 tablet, Rfl: 0 .  rizatriptan (MAXALT) 10 MG tablet, Take  10 mg by mouth as needed for migraine. May repeat in 2 hours if needed, Disp: , Rfl:    Allergies  Allergen Reactions  . Codeine Anaphylaxis    Review of Systems   ROS  Negative unless otherwise specified per HPI.  Vitals:   Vitals:   09/07/18 1448  BP: (!) 158/100  Pulse: 89  SpO2: 98%  Weight: 155 lb (70.3 kg)  Height: 5' 8"  (1.727 m)     Body mass index is 23.57 kg/m.   Physical Exam:    Physical Exam  Constitutional: She appears well-developed. She is cooperative.  Non-toxic appearance. She does not have a sickly appearance. She does not appear ill. No distress.  Cardiovascular: Normal rate, regular rhythm, S1 normal, S2 normal, normal heart sounds and normal pulses.  No LE edema  Pulmonary/Chest: Effort normal and breath sounds normal.  Neurological: She is alert. GCS eye subscore is 4. GCS verbal subscore is 5. GCS motor subscore is 6.  Skin: Skin is warm, dry and intact.  Psychiatric: She has a normal mood and affect. Her speech is normal and behavior is normal.  Nursing note and vitals reviewed.    Assessment and Plan:    [name] was seen today for hypertension.  Diagnoses and all orders for this visit:  Elevated blood pressure reading Uncontrolled. She is resistant to starting medication at this time, we discussed continuing to work on lifestyle reduction and stress management. Follow-up in 1 week or sooner if symptoms change. Patient verbalized understanding to plan. -     CBC with Differential/Platelet -     Comprehensive metabolic panel -     TSH  . Reviewed expectations re: course of current medical issues. . Discussed self-management of symptoms. . Outlined signs and symptoms indicating need for more acute intervention. . Patient verbalized understanding and all questions were answered. . See orders for this visit as documented in the electronic medical record. . Patient received an After Visit Summary.  CMA or LPN served as scribe during this  visit. History, Physical, and Plan performed by medical provider. The above documentation has been reviewed and is accurate and complete.  Catherine Coke, PA-C [place] [place] 09/07/2018  Follow-up: No follow-ups on file.

## 2018-09-08 LAB — COMPREHENSIVE METABOLIC PANEL
AG Ratio: 1.6 (calc) (ref 1.0–2.5)
ALBUMIN MSPROF: 4.4 g/dL (ref 3.6–5.1)
ALT: 9 U/L (ref 6–29)
AST: 13 U/L (ref 10–30)
Alkaline phosphatase (APISO): 69 U/L (ref 33–115)
BUN: 8 mg/dL (ref 7–25)
CO2: 24 mmol/L (ref 20–32)
CREATININE: 0.95 mg/dL (ref 0.50–1.10)
Calcium: 9.7 mg/dL (ref 8.6–10.2)
Chloride: 105 mmol/L (ref 98–110)
GLUCOSE: 78 mg/dL (ref 65–99)
Globulin: 2.8 g/dL (calc) (ref 1.9–3.7)
POTASSIUM: 3.4 mmol/L — AB (ref 3.5–5.3)
SODIUM: 144 mmol/L (ref 135–146)
TOTAL PROTEIN: 7.2 g/dL (ref 6.1–8.1)
Total Bilirubin: 0.4 mg/dL (ref 0.2–1.2)

## 2018-09-08 LAB — CBC WITH DIFFERENTIAL/PLATELET
BASOS PCT: 0.6 %
Basophils Absolute: 48 cells/uL (ref 0–200)
EOS PCT: 4.6 %
Eosinophils Absolute: 368 cells/uL (ref 15–500)
HEMATOCRIT: 41.3 % (ref 35.0–45.0)
Hemoglobin: 14.5 g/dL (ref 11.7–15.5)
Lymphs Abs: 4088 cells/uL — ABNORMAL HIGH (ref 850–3900)
MCH: 30.6 pg (ref 27.0–33.0)
MCHC: 35.1 g/dL (ref 32.0–36.0)
MCV: 87.1 fL (ref 80.0–100.0)
MPV: 10.7 fL (ref 7.5–12.5)
Monocytes Relative: 6.6 %
NEUTROS PCT: 37.1 %
Neutro Abs: 2968 cells/uL (ref 1500–7800)
Platelets: 270 10*3/uL (ref 140–400)
RBC: 4.74 10*6/uL (ref 3.80–5.10)
RDW: 12.7 % (ref 11.0–15.0)
TOTAL LYMPHOCYTE: 51.1 %
WBC mixed population: 528 cells/uL (ref 200–950)
WBC: 8 10*3/uL (ref 3.8–10.8)

## 2018-09-08 LAB — TSH: TSH: 1.26 mIU/L

## 2018-10-22 DIAGNOSIS — Z3042 Encounter for surveillance of injectable contraceptive: Secondary | ICD-10-CM | POA: Diagnosis not present

## 2018-10-24 ENCOUNTER — Other Ambulatory Visit: Payer: Self-pay | Admitting: Physician Assistant

## 2018-10-24 ENCOUNTER — Telehealth: Payer: Self-pay | Admitting: Physician Assistant

## 2018-10-24 NOTE — Telephone Encounter (Signed)
Please see message and advise 

## 2018-10-24 NOTE — Telephone Encounter (Signed)
No recommendations at this time. Agree with Kara's recommendation - patient needs office visit.

## 2018-10-24 NOTE — Telephone Encounter (Signed)
See note  Copied from [place] [id number]. Topic: General - Other >> Oct 24, 2018  3:35 PM Janace Aris A wrote: Reason for CRM:Pt called in today and requested to have a med refill on the medication amoxicillin-clavulanate (AUGMENTIN), she says she is still experiencing the same symptoms as before so she feels as though she shouldn't have to be seen for a OV I advised pt a OV would need to be made to check for any new symptoms that she may be having, and also the medication was discontinued due to it being a completed course. Pt declined to make a OV at this time.   Please advise

## 2018-10-25 NOTE — Telephone Encounter (Signed)
See note

## 2018-10-25 NOTE — Telephone Encounter (Signed)
Patient called back regarding the refill on the amoxicillin-clavulanate (AUGMENTIN) 400-57 MG/5ML suspension She was informed that she need to be seen but still did not agree and refused to schedule an appointment to come in.

## 2018-10-26 NOTE — Telephone Encounter (Signed)
Noted, pt told needs appointment by [place].

## 2018-10-29 DIAGNOSIS — G43719 Chronic migraine without aura, intractable, without status migrainosus: Secondary | ICD-10-CM | POA: Diagnosis not present

## 2018-10-29 DIAGNOSIS — G43019 Migraine without aura, intractable, without status migrainosus: Secondary | ICD-10-CM | POA: Diagnosis not present

## 2018-10-31 ENCOUNTER — Encounter: Payer: Self-pay | Admitting: Physician Assistant

## 2018-10-31 ENCOUNTER — Ambulatory Visit: Payer: Federal, State, Local not specified - PPO | Admitting: Physician Assistant

## 2018-10-31 VITALS — BP 128/88 | HR 88 | Temp 99.0°F | Ht 68.0 in | Wt 155.5 lb

## 2018-10-31 DIAGNOSIS — J0191 Acute recurrent sinusitis, unspecified: Secondary | ICD-10-CM | POA: Diagnosis not present

## 2018-10-31 MED ORDER — PREDNISONE 20 MG PO TABS: 40.0000 mg | ORAL_TABLET | Freq: Every day | ORAL | 0 refills | Status: DC

## 2018-10-31 MED ORDER — CEFUROXIME AXETIL 500 MG PO TABS: 500.0000 mg | ORAL_TABLET | Freq: Two times a day (BID) | ORAL | 0 refills | Status: AC

## 2018-10-31 NOTE — Progress Notes (Signed)
Catherine Cox is a 42 y.o. female here for a new problem.   History of Present Illness:   Chief Complaint  Patient presents with  . Sinus Problem    possal drainage and facial pressure and headahes x 1 week      HPI   Patient reports sinus pressure x 7 days. Significant nasal congestion. Denies fever, chills, sore throat, body aches. She has tried Sudafed and Copywriter, advertising without relief of symptoms. Normal appetite. Pushing fluids. She has history of sinus infections and does well generally with Ceftin and Prednisone and is requesting this today. Has seen ENT in the past and they prescribe this regimen for her.  Also notes that she went to her [facility] doctor yesterday and was told her BP was fantastic, around 118/70-something.  BP Readings from Last 3 Encounters:  10/31/18 128/88  09/07/18 (!) 158/100  08/10/18 (!) 144/98      Past Medical History:  Diagnosis Date  . Environmental allergies   . Migraine headache    [facility] -- Dr. Domingo Cocking, has been going since 2004     Social History   Socioeconomic History  . Marital status: Single    Spouse name: Not on file  . Number of children: Not on file  . Years of education: Not on file  . Highest education level: Not on file  Occupational History  . Not on file  Social Needs  . Financial resource strain: Not on file  . Food insecurity:    Worry: Not on file    Inability: Not on file  . Transportation needs:    Medical: Not on file    Non-medical: Not on file  Tobacco Use  . Smoking status: Never Smoker  . Smokeless tobacco: Never Used  Substance and Sexual Activity  . Alcohol use: Yes    Comment: occ  . Drug use: No  . Sexual activity: Yes    Birth control/protection: Injection  Lifestyle  . Physical activity:    Days per week: Not on file    Minutes per session: Not on file  . Stress: Not on file  Relationships  . Social connections:    Talks on phone: Not on file    Gets together: Not on file     Attends religious service: Not on file    Active member of club or organization: Not on file    Attends meetings of clubs or organizations: Not on file    Relationship status: Not on file  . Intimate partner violence:    Fear of current or ex partner: Not on file    Emotionally abused: Not on file    Physically abused: Not on file    Forced sexual activity: Not on file  Other Topics Concern  . 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 TONSILLECTOMY      Family History  Problem Relation Age of Onset  . Alzheimer's disease Mother   . Cancer Father        lymphoma and lung  . Breast cancer Neg Hx   . Colon cancer Neg Hx     Allergies  Allergen Reactions  . Codeine Anaphylaxis    Current Medications:   Current Outpatient Medications:  .  albuterol (PROVENTIL HFA;VENTOLIN HFA) 108 (90 Base) MCG/ACT inhaler, TAKE 2  PUFFS BY MOUTH EVERY 6 HOURS AS NEEDED FOR WHEEZE OR SHORTNESS OF BREATH, Disp: 8.5 Inhaler, Rfl: 1 .  cyclobenzaprine (FLEXERIL) 10 MG tablet, Take 10 mg by mouth 3 (three) times daily as needed for muscle spasms., Disp: , Rfl:  .  medroxyPROGESTERone (DEPO-PROVERA) 150 MG/ML injection, Inject 150 mg into the muscle. Every 8-10 weeks. , Disp: , Rfl:  .  promethazine (PHENERGAN) 25 MG tablet, Take 1 tablet (25 mg total) by mouth every 6 (six) hours as needed for nausea or vomiting., Disp: 30 tablet, Rfl: 0 .  rizatriptan (MAXALT) 10 MG tablet, Take 10 mg by mouth as needed for migraine. May repeat in 2 hours if needed, Disp: , Rfl:  .  cefUROXime (CEFTIN) 500 MG tablet, Take 1 tablet (500 mg total) by mouth 2 (two) times daily with a meal for 7 days., Disp: 14 tablet, Rfl: 0 .  predniSONE (DELTASONE) 20 MG tablet, Take 2 tablets (40 mg total) by mouth daily., Disp: 10 tablet, Rfl: 0   Review of Systems:   Review of Systems   Constitutional: Negative for chills, fever and malaise/fatigue.  HENT: Positive for congestion and sinus pain. Negative for sore throat.   Respiratory: Negative for cough, sputum production and shortness of breath.   Neurological: Negative for dizziness and headaches.    Vitals:   Vitals:   10/31/18 0841  BP: 128/88  Pulse: 88  Temp: 99 F (37.2 C)  TempSrc: Oral  SpO2: 98%  Weight: 155 lb 8 oz (70.5 kg)  Height: 5' 8"  (1.727 m)     Body mass index is 23.64 kg/m.  Physical Exam:   Physical Exam  Constitutional: She appears well-developed. She is cooperative.  Non-toxic appearance. She does not have a sickly appearance. She does not appear ill. No distress.  HENT:  Head: Normocephalic and atraumatic.  Right Ear: Tympanic membrane, external ear and ear canal normal. Tympanic membrane is not erythematous, not retracted and not bulging.  Left Ear: Tympanic membrane, external ear and ear canal normal. Tympanic membrane is not erythematous, not retracted and not bulging.  Nose: Mucosal edema and rhinorrhea present. Right sinus exhibits frontal sinus tenderness. Right sinus exhibits no maxillary sinus tenderness. Left sinus exhibits frontal sinus tenderness. Left sinus exhibits no maxillary sinus tenderness.  Mouth/Throat: Uvula is midline and mucous membranes are normal. Posterior oropharyngeal erythema present. No posterior oropharyngeal edema. Tonsils are 0 on the right. Tonsils are 0 on the left.  Eyes: Conjunctivae and lids are normal.  Neck: Trachea normal.  Cardiovascular: Normal rate, regular rhythm, S1 normal, S2 normal and normal heart sounds.  Pulmonary/Chest: Effort normal and breath sounds normal. She has no decreased breath sounds. She has no wheezes. She has no rhonchi. She has no rales.  Lymphadenopathy:    She has no cervical adenopathy.  Neurological: She is alert.  Skin: Skin is warm, dry and intact.  Psychiatric: She has a normal mood and affect. Her speech is  normal and behavior is normal.  Nursing note and vitals reviewed.    Assessment and Plan:   Catherine Cox was seen today for sinus problem.  Diagnoses and all orders for this visit:  Acute recurrent sinusitis, unspecified location No red flags on exam.  Will initiate Ceftin and Prednisone per orders. Discussed taking medications as prescribed. Reviewed return precautions including worsening fever, SOB, worsening cough or other concerns. Push fluids and rest. I recommend that patient follow-up if symptoms worsen or persist despite treatment x 7-10 days, sooner if needed.  Other orders -     cefUROXime (CEFTIN) 500 MG tablet; Take 1 tablet (500 mg total) by mouth 2 (two) times daily with a meal for 7 days. -     predniSONE (DELTASONE) 20 MG tablet; Take 2 tablets (40 mg total) by mouth daily.    . Reviewed expectations re: course of current medical issues. . Discussed self-management of symptoms. . Outlined signs and symptoms indicating need for more acute intervention. . Patient verbalized understanding and all questions were answered. . See orders for this visit as documented in the electronic medical record. . Patient received an After-Visit Summary.    [name], PA-C

## 2018-10-31 NOTE — Patient Instructions (Signed)
It was great to see you!  Use medication as prescribed: Ceftin and Prednisone  Push fluids and get plenty of rest. Please return if you are not improving as expected, or if you have high fevers (>101.5) or difficulty swallowing or worsening productive cough.  Call clinic with questions.  I hope you start feeling better soon!

## 2018-12-10 DIAGNOSIS — Z1151 Encounter for screening for human papillomavirus (HPV): Secondary | ICD-10-CM | POA: Diagnosis not present

## 2018-12-10 DIAGNOSIS — Z6823 Body mass index (BMI) 23.0-23.9, adult: Secondary | ICD-10-CM | POA: Diagnosis not present

## 2018-12-10 DIAGNOSIS — Z01419 Encounter for gynecological examination (general) (routine) without abnormal findings: Secondary | ICD-10-CM | POA: Diagnosis not present

## 2018-12-10 LAB — HM PAP SMEAR: HM Pap smear: NEGATIVE

## 2018-12-12 DIAGNOSIS — G43019 Migraine without aura, intractable, without status migrainosus: Secondary | ICD-10-CM | POA: Diagnosis not present

## 2018-12-12 DIAGNOSIS — G43719 Chronic migraine without aura, intractable, without status migrainosus: Secondary | ICD-10-CM | POA: Diagnosis not present

## 2018-12-13 ENCOUNTER — Encounter: Payer: Self-pay | Admitting: Physician Assistant

## 2018-12-17 ENCOUNTER — Other Ambulatory Visit: Payer: Self-pay | Admitting: Obstetrics and Gynecology

## 2018-12-17 ENCOUNTER — Encounter: Payer: Self-pay | Admitting: Physician Assistant

## 2018-12-17 ENCOUNTER — Ambulatory Visit (INDEPENDENT_AMBULATORY_CARE_PROVIDER_SITE_OTHER): Payer: Federal, State, Local not specified - PPO | Admitting: Physician Assistant

## 2018-12-17 VITALS — BP 160/90 | HR 102 | Temp 98.9°F | Ht 68.0 in | Wt 151.2 lb

## 2018-12-17 DIAGNOSIS — J0191 Acute recurrent sinusitis, unspecified: Secondary | ICD-10-CM | POA: Diagnosis not present

## 2018-12-17 DIAGNOSIS — Z3042 Encounter for surveillance of injectable contraceptive: Secondary | ICD-10-CM | POA: Diagnosis not present

## 2018-12-17 MED ORDER — CEFUROXIME AXETIL 500 MG PO TABS: 500.0000 mg | ORAL_TABLET | Freq: Two times a day (BID) | ORAL | 0 refills | Status: AC

## 2018-12-17 MED ORDER — PREDNISONE 20 MG PO TABS: 40.0000 mg | ORAL_TABLET | Freq: Every day | ORAL | 0 refills | Status: DC

## 2018-12-17 NOTE — Progress Notes (Signed)
Catherine Cox is a 43 y.o. female here for a new problem.  History of Present Illness:   Chief Complaint  Patient presents with  . Sinus Problem    Sinus Problem  This is a new problem. Episode onset: Started 3 days ago. The problem has been gradually worsening since onset. There has been no fever. Her pain is at a severity of 8/10. The pain is severe. Associated symptoms include congestion (Nasal with yellow/green), headaches and sinus pressure. Pertinent negatives include no ear pain, shortness of breath or sore throat. (Post nasal drip) Treatments tried: Flonase. The treatment provided mild relief.   She has a history of significant sinus disease, has seen ENT in the past.  She went to ob-gyn last week and states that her blood pressure was normal at that time.  Past Medical History:  Diagnosis Date  . Environmental allergies   . Migraine headache    [facility] -- Dr. Domingo Cock[facility], has been going since 2004     Social History   Socioeconomic History  . Marital status: Single    Spouse name: Not on file  . Number of children: Not on file  . Years of education: Not on file  . Highest education level: Not on file  Occupational History  . Not on file  Social Needs  . Financial resource strain: Not on file  . Food insecurity:    Worry: Not on file    Inability: Not on file  . Transportation needs:    Medical: Not on file    Non-medical: Not on file  Tobacco Use  . Smoking status: Never Smoker  . Smokeless tobacco: Never Used  Substance and Sexual Activity  . Alcohol use: Yes    Comment: occ  . Drug use: No  . Sexual activity: Yes    Birth control/protection: Injection  Lifestyle  . Physical activity:    Days per week: Not on file    Minutes per session: Not on file  . Stress: Not on file  Relationships  . Social connections:    Talks on phone: Not on file    Gets together: Not on file    Attends religious service: Not on file    Active member of club  or organization: Not on file    Attends meetings of clubs or organizations: Not on file    Relationship status: Not on file  . Intimate partner violence:    Fear of current or ex partner: Not on file    Emotionally abused: Not on file    Physically abused: Not on file    Forced sexual activity: Not on file  Other Topics Concern  . 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 TONSILLECTOMY      Family History  Problem Relation Age of Onset  . Alzheimer's disease Mother   . Cancer Father        lymphoma and lung  . Breast cancer Neg Hx   . Colon cancer Neg Hx     Allergies  Allergen Reactions  . Codeine Anaphylaxis    Current Medications:   Current Outpatient Medications:  .  albuterol (PROVENTIL HFA;VENTOLIN HFA) 108 (90 Base) MCG/ACT inhaler, TAKE 2 PUFFS BY MOUTH EVERY 6 HOURS AS NEEDED FOR WHEEZE OR SHORTNESS OF BREATH, Disp: 8.5 Inhaler, Rfl:  1 .  cyclobenzaprine (FLEXERIL) 10 MG tablet, Take 10 mg by mouth 3 (three) times daily as needed for muscle spasms., Disp: , Rfl:  .  medroxyPROGESTERone (DEPO-PROVERA) 150 MG/ML injection, Inject 150 mg into the muscle. Every 8-10 weeks. , Disp: , Rfl:  .  promethazine (PHENERGAN) 25 MG tablet, Take 1 tablet (25 mg total) by mouth every 6 (six) hours as needed for nausea or vomiting., Disp: 30 tablet, Rfl: 0 .  rizatriptan (MAXALT) 10 MG tablet, Take 10 mg by mouth as needed for migraine. May repeat in 2 hours if needed, Disp: , Rfl:  .  cefUROXime (CEFTIN) 500 MG tablet, Take 1 tablet (500 mg total) by mouth 2 (two) times daily with a meal for 7 days., Disp: 14 tablet, Rfl: 0 .  predniSONE (DELTASONE) 20 MG tablet, Take 2 tablets (40 mg total) by mouth daily., Disp: 10 tablet, Rfl: 0   Review of Systems:   Review of Systems  HENT: Positive for congestion (Nasal with yellow/green) and sinus pressure.  Negative for ear pain and sore throat.   Respiratory: Negative for shortness of breath.   Neurological: Positive for headaches.    Vitals:   Vitals:   12/17/18 1318  BP: (!) 160/90  Pulse: (!) 102  Temp: 98.9 F (37.2 C)  TempSrc: Oral  SpO2: 98%  Weight: 151 lb 4 oz (68.6 kg)  Height: 5' 8"  (1.727 m)     Body mass index is 23 kg/m.  Physical Exam:   Physical Exam Vitals signs and nursing note reviewed.  Constitutional:      General: She is not in acute distress.    Appearance: She is well-developed. She is not ill-appearing or toxic-appearing.  HENT:     Head: Normocephalic and atraumatic.     Right Ear: Tympanic membrane, ear canal and external ear normal. Tympanic membrane is not erythematous, retracted or bulging.     Left Ear: Tympanic membrane, ear canal and external ear normal. Tympanic membrane is not erythematous, retracted or bulging.     Nose: Mucosal edema, congestion and rhinorrhea present.     Right Sinus: Maxillary sinus tenderness present. No frontal sinus tenderness.     Left Sinus: Maxillary sinus tenderness present. No frontal sinus tenderness.     Mouth/Throat:     Lips: Pink.     Pharynx: Uvula midline. Posterior oropharyngeal erythema present.     Tonsils: No tonsillar exudate. Swelling: 0 on the right. 0 on the left.  Eyes:     General: Lids are normal.     Conjunctiva/sclera: Conjunctivae normal.  Neck:     Trachea: Trachea normal.  Cardiovascular:     Rate and Rhythm: Normal rate and regular rhythm.     Heart sounds: Normal heart sounds, S1 normal and S2 normal.  Pulmonary:     Effort: Pulmonary effort is normal.     Breath sounds: Normal breath sounds. No decreased breath sounds, wheezing, rhonchi or rales.  Lymphadenopathy:     Cervical: No cervical adenopathy.  Skin:    General: Skin is warm and dry.  Neurological:     Mental Status: She is alert.     GCS: GCS eye subscore is 4. GCS verbal subscore is 5. GCS motor subscore is 6.   Psychiatric:        Speech: Speech normal.        Behavior: Behavior normal. Behavior is cooperative.     Assessment and Plan:   [name] was seen today for sinus  problem.  Diagnoses and all orders for this visit:  Acute recurrent sinusitis, unspecified location No red flags on exam.  Will initiate prednisone and ceftin per orders. Discussed taking medications as prescribed. Reviewed return precautions including worsening fever, SOB, worsening cough or other concerns. Push fluids and rest. I recommend that patient follow-up if symptoms worsen or persist despite treatment x 7-10 days, sooner if needed.  Also advised as follows: If headache or elevated blood pressure remains present, please return to the office.  Other orders -     cefUROXime (CEFTIN) 500 MG tablet; Take 1 tablet (500 mg total) by mouth 2 (two) times daily with a meal for 7 days. -     predniSONE (DELTASONE) 20 MG tablet; Take 2 tablets (40 mg total) by mouth daily.    . Reviewed expectations re: course of current medical issues. . Discussed self-management of symptoms. . Outlined signs and symptoms indicating need for more acute intervention. . Patient verbalized understanding and all questions were answered. . See orders for this visit as documented in the electronic medical record. . Patient received an After-Visit Summary.  CMA or LPN served as scribe during this visit. History, Physical, and Plan performed by medical provider. The above documentation has been reviewed and is accurate and complete.  [name], PA-C

## 2018-12-17 NOTE — Patient Instructions (Signed)
It was great to see you!  Use medication as prescribed: ceftin and prednisone  Push fluids and get plenty of rest. Please return if you are not improving as expected, or if you have high fevers (>101.5) or difficulty swallowing or worsening productive cough.  If headache or elevated blood pressure remains present, please return to the office.  Call clinic with questions.  I hope you start feeling better soon!

## 2018-12-19 ENCOUNTER — Other Ambulatory Visit: Payer: Self-pay | Admitting: Obstetrics and Gynecology

## 2018-12-25 ENCOUNTER — Encounter: Payer: Self-pay | Admitting: Physical Therapy

## 2018-12-28 ENCOUNTER — Ambulatory Visit (INDEPENDENT_AMBULATORY_CARE_PROVIDER_SITE_OTHER): Payer: Federal, State, Local not specified - PPO | Admitting: Physician Assistant

## 2018-12-28 ENCOUNTER — Encounter: Payer: Self-pay | Admitting: Physician Assistant

## 2018-12-28 VITALS — BP 146/100 | HR 116 | Temp 98.8°F | Ht 68.0 in | Wt 153.4 lb

## 2018-12-28 DIAGNOSIS — J101 Influenza due to other identified influenza virus with other respiratory manifestations: Secondary | ICD-10-CM

## 2018-12-28 LAB — POC INFLUENZA A&B (BINAX/QUICKVUE)
Influenza A, POC: POSITIVE — AB
Influenza B, POC: NEGATIVE

## 2018-12-28 MED ORDER — ALBUTEROL SULFATE HFA 108 (90 BASE) MCG/ACT IN AERS: 1.0000 | INHALATION_SPRAY | RESPIRATORY_TRACT | 1 refills | Status: DC | PRN

## 2018-12-28 MED ORDER — OSELTAMIVIR PHOSPHATE 75 MG PO CAPS: 75.0000 mg | ORAL_CAPSULE | Freq: Two times a day (BID) | ORAL | 0 refills | Status: DC

## 2018-12-28 MED ORDER — BUDESONIDE-FORMOTEROL FUMARATE 80-4.5 MCG/ACT IN AERO: 2.0000 | INHALATION_SPRAY | Freq: Two times a day (BID) | RESPIRATORY_TRACT | 3 refills | Status: DC

## 2018-12-28 MED ORDER — AZITHROMYCIN 250 MG PO TABS: ORAL_TABLET | ORAL | 0 refills | Status: DC

## 2018-12-28 NOTE — Patient Instructions (Signed)
It was great to see you!  You have the flu.  Use medication as prescribed: symbicort inhaler in AM and PM, albuterol inhaler in between  Please wait to take the Azithromycin antibiotic --> if after a week you still have cough, or worsening symptoms, may take  Tamiflu (the flu medication) was sent in if you decide to take it.  Push fluids and get plenty of rest. Please return if you are not improving as expected, or if you have high fevers (>101.5) or difficulty swallowing or worsening productive cough.  Call clinic with questions.  I hope you start feeling better soon!

## 2018-12-28 NOTE — Progress Notes (Signed)
Catherine Cox is a 43 y.o. female here for a new problem.  I acted as a Education administrator for [employer], PA-C [name], [place]   History of Present Illness:   Chief Complaint  Patient presents with  . Cough    Cough  This is a new problem. Episode onset: Started Wednesday night. The problem has been gradually worsening. The cough is non-productive. Associated symptoms include chills, a fever, headaches and nasal congestion. Pertinent negatives include no sore throat or shortness of breath. Associated symptoms comments: C/o chest congestion and chest hurts when she coughs.. The symptoms are aggravated by lying down. She has tried OTC cough suppressant (Robitussin DM) for the symptoms. The treatment provided no relief. Her past medical history is significant for bronchitis and pneumonia. There is no history of asthma.    Past Medical History:  Diagnosis Date  . Environmental allergies   . Migraine headache    [facility] Dr[facility] Dom[facility], has been going since 2004     Social History   Socioeconomic History  . Marital status: Single    Spouse name: Not on file  . Number of children: Not on file  . Years of education: Not on file  . Highest education level: Not on file  Occupational History  . Not on file  Social Needs  . Financial resource strain: Not on file  . Food insecurity:    Worry: Not on file    Inability: Not on file  . Transportation needs:    Medical: Not on file    Non-medical: Not on file  Tobacco Use  . Smoking status: Never Smoker  . Smokeless tobacco: Never Used  Substance and Sexual Activity  . Alcohol use: Yes    Comment: occ  . Drug use: No  . Sexual activity: Yes    Birth control/protection: Injection  Lifestyle  . Physical activity:    Days per week: Not on file    Minutes per session: Not on file  . Stress: Not on file  Relationships  . Social connections:    Talks on phone: Not on file    Gets together: Not on file    Attends  religious service: Not on file    Active member of club or organization: Not on file    Attends meetings of clubs or organizations: Not on file    Relationship status: Not on file  . Intimate partner violence:    Fear of current or ex partner: Not on file    Emotionally abused: Not on file    Physically abused: Not on file    Forced sexual activity: Not on file  Other Topics Concern  . 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 TONSILLECTOMY      Family History  Problem Relation Age of Onset  . Alzheimer's disease Mother   . Cancer Father        lymphoma and lung  . Breast cancer Neg Hx   . Colon cancer Neg Hx     Allergies  Allergen Reactions  . Codeine Anaphylaxis    Current Medications:   Current Outpatient Medications:  .  cyclobenzaprine (FLEXERIL) 10 MG tablet, Take 10 mg by mouth 3 (three) times daily as needed for muscle spasms., Disp: , Rfl:  .  medroxyPROGESTERone (DEPO-PROVERA) 150  MG/ML injection, Inject 150 mg into the muscle. Every 8-10 weeks. , Disp: , Rfl:  .  promethazine (PHENERGAN) 25 MG tablet, Take 1 tablet (25 mg total) by mouth every 6 (six) hours as needed for nausea or vomiting., Disp: 30 tablet, Rfl: 0 .  rizatriptan (MAXALT) 10 MG tablet, Take 10 mg by mouth as needed for migraine. May repeat in 2 hours if needed, Disp: , Rfl:  .  albuterol (PROVENTIL HFA;VENTOLIN HFA) 108 (90 Base) MCG/ACT inhaler, Inhale 1-2 puffs into the lungs every 4 (four) hours as needed for wheezing or shortness of breath., Disp: 8.5 Inhaler, Rfl: 1 .  azithromycin (ZITHROMAX) 250 MG tablet, Take two tablets on day 1, then one tablet daily x 4 days, Disp: 6 tablet, Rfl: 0 .  budesonide-formoterol (SYMBICORT) 80-4.5 MCG/ACT inhaler, Inhale 2 puffs into the lungs 2 (two) times daily., Disp: 1 Inhaler, Rfl: 3 .  oseltamivir (TAMIFLU) 75 MG capsule,  Take 1 capsule (75 mg total) by mouth 2 (two) times daily., Disp: 10 capsule, Rfl: 0   Review of Systems:   Review of Systems  Constitutional: Positive for chills and fever.  HENT: Negative for sore throat.   Respiratory: Positive for cough. Negative for shortness of breath.   Neurological: Positive for headaches.    Vitals:   Vitals:   12/28/18 0759  BP: (!) 146/100  Pulse: (!) 116  Temp: 98.8 F (37.1 C)  TempSrc: Oral  SpO2: 97%  Weight: 153 lb 6.1 oz (69.6 kg)  Height: 5' 8"  (1.727 m)     Body mass index is 23.32 kg/m.  Physical Exam:   Physical Exam Vitals signs and nursing note reviewed.  Constitutional:      General: She is not in acute distress.    Appearance: She is well-developed. She is not ill-appearing or toxic-appearing.  HENT:     Head: Normocephalic and atraumatic.     Right Ear: Tympanic membrane, ear canal and external ear normal. Tympanic membrane is not erythematous, retracted or bulging.     Left Ear: Tympanic membrane, ear canal and external ear normal. Tympanic membrane is not erythematous, retracted or bulging.     Nose: Nose normal.     Right Sinus: No maxillary sinus tenderness or frontal sinus tenderness.     Left Sinus: No maxillary sinus tenderness or frontal sinus tenderness.     Mouth/Throat:     Pharynx: Uvula midline. No posterior oropharyngeal erythema.  Eyes:     General: Lids are normal.     Conjunctiva/sclera: Conjunctivae normal.  Neck:     Trachea: Trachea normal.  Cardiovascular:     Rate and Rhythm: Regular rhythm. Tachycardia present.     Heart sounds: Normal heart sounds, S1 normal and S2 normal.  Pulmonary:     Effort: Pulmonary effort is normal.     Breath sounds: Normal breath sounds. No decreased breath sounds, wheezing, rhonchi or rales.  Lymphadenopathy:     Cervical: No cervical adenopathy.  Skin:    General: Skin is warm and dry.  Neurological:     Mental Status: She is alert.  Psychiatric:        Speech:  Speech normal.        Behavior: Behavior normal. Behavior is cooperative.     Results for orders placed or performed in visit on 12/28/18  POC Influenza A&B(BINAX/QUICKVUE)  Result Value Ref Range   Influenza A, POC Positive (A) Negative   Influenza B, POC Negative Negative    Assessment  and Plan:   Corley was seen today for cough.  Diagnoses and all orders for this visit:  Influenza A Has significant history of migraines, discussed that Tamiflu can cause worsening headaches. She is undecided if she would like to take it -- I have sent it in. Will trial Symbicort scheduled for cough and Albuterol prn. I did provide her with a safety net prescription of Azithromycin should her symptoms worsen, she is very concerned that she is going to develop bronchitis or PNA. I also advised her to push fluids. Ideally, I asked her to return to our office when she is feeling better so we can re-evaluate her blood pressure. Discussed taking medications as prescribed. Reviewed return precautions including worsening fever, SOB, worsening cough or other concerns. Push fluids and rest. I recommend that patient follow-up if symptoms worsen or persist despite treatment x 7-10 days, sooner if needed. -     POC Influenza A&B(BINAX/QUICKVUE)  Other orders -     budesonide-formoterol (SYMBICORT) 80-4.5 MCG/ACT inhaler; Inhale 2 puffs into the lungs 2 (two) times daily. -     albuterol (PROVENTIL HFA;VENTOLIN HFA) 108 (90 Base) MCG/ACT inhaler; Inhale 1-2 puffs into the lungs every 4 (four) hours as needed for wheezing or shortness of breath. -     oseltamivir (TAMIFLU) 75 MG capsule; Take 1 capsule (75 mg total) by mouth 2 (two) times daily. -     azithromycin (ZITHROMAX) 250 MG tablet; Take two tablets on day 1, then one tablet daily x 4 days  . Reviewed expectations re: course of current medical issues. . Discussed self-management of symptoms. . Outlined signs and symptoms indicating need for more acute  intervention. . Patient verbalized understanding and all questions were answered. . See orders for this visit as documented in the electronic medical record. . Patient received an After-Visit Summary.  CMA or LPN served as scribe during this visit. History, Physical, and Plan performed by medical provider. The above documentation has been reviewed and is accurate and complete.   [name], PA-C

## 2018-12-31 ENCOUNTER — Telehealth: Payer: Self-pay | Admitting: Physician Assistant

## 2018-12-31 ENCOUNTER — Encounter: Payer: Self-pay | Admitting: *Deleted

## 2018-12-31 NOTE — Telephone Encounter (Signed)
Copied from [place] [id number]. Topic: General - Other >> Dec 31, 2018  8:40 AM Nathanial Millman J wrote: Reason for CRM: pt needs to have note to be out of work through Wednesday. She will need to return to work on [place] 01/03/19 Please call when ready for pick up - [phone]

## 2018-12-31 NOTE — Telephone Encounter (Signed)
Spoke to pt told her return to work note is ready. Pt verbalized understanding.

## 2019-01-04 ENCOUNTER — Other Ambulatory Visit: Payer: Self-pay | Admitting: Physician Assistant

## 2019-01-04 ENCOUNTER — Ambulatory Visit: Payer: Self-pay

## 2019-01-04 NOTE — Telephone Encounter (Signed)
See note

## 2019-01-04 NOTE — Telephone Encounter (Signed)
Pt returned call for triage of symptoms. Pt has requested refill of Azithromycin.  She was seen in the office 12/28/2018 and received a tamiflu RX and Azithromycin RX dose pac.  She is concerned that she still has congestion and cough.  She has no fever.  Her sinus runs clear during the day but stop up at night. Pt is coughing as she speak to me. She states she does not want it to get worse. Pt was advised that the medication in azithrmycin last 10 days. Pt verbalized understanding.  Care advice read to patient. Pt verbalized understanding.  Reason for Disposition . [1] Sinus congestion as part of a cold AND [2] present < 10 days  Answer Assessment - Initial Assessment Questions 1. LOCATION: "Where does it hurt?"      No pain 2. ONSET: "When did the sinus pain start?"  (e.g., hours, days)      1 week ago 3. SEVERITY: "How bad is the pain?"   (Scale 1-10; mild, moderate or severe)   - MILD (1-3): doesn't interfere with normal activities    - MODERATE (4-7): interferes with normal activities (e.g., work or school) or awakens from sleep   - SEVERE (8-10): excruciating pain and patient unable to do any normal activities        Moderate annoying sinus drainage and cough 4. RECURRENT SYMPTOM: "Have you ever had sinus problems before?" If so, ask: "When was the last time?" and "What happened that time?"      Yes was recently treated 5. NASAL CONGESTION: "Is the nose blocked?" If so, ask, "Can you open it or must you breathe through the mouth?"     At night and runs at night 6. NASAL DISCHARGE: "Do you have discharge from your nose?" If so ask, "What color?"     clear 7. FEVER: "Do you have a fever?" If so, ask: "What is it, how was it measured, and when did it start?"      no 8. OTHER SYMPTOMS: "Do you have any other symptoms?" (e.g., sore throat, cough, earache, difficulty breathing)     Headache yesterday 9. PREGNANCY: "Is there any chance you are pregnant?" "When was your last menstrual period?"     No depo  Protocols used: SINUS PAIN OR CONGESTION-A-AH

## 2019-01-04 NOTE — Telephone Encounter (Signed)
Spoke to pt told her we can not refill antibiotic, if you are having worsening symptoms need to be seen. Pt verbalized understanding. Told pt cough can last a few weeks to go away. Asked if taking cough medicine? Pt said Robitussin. Told pt to try Delsym see if that helps. Pt verbalized understanding.

## 2019-01-04 NOTE — Telephone Encounter (Signed)
Contact patient to schedule appointment for medication

## 2019-01-04 NOTE — Telephone Encounter (Signed)
Pt would like a refill of azithromycin (ZITHROMAX) 250 MG tablet  Due to still experiencing drainage and a cough. Pt wants to clear it up due to being back at work/ please advise

## 2019-01-04 NOTE — Telephone Encounter (Signed)
Requested medication (s) are due for refill today: yes  Requested medication (s) are on the active medication list: no  Last refill:  12/28/2018  Future visit scheduled: no  Notes to clinic:   Off protocol. Attempted triage of symptoms.Left VM to call office. RX request for continued cough.    Requested Prescriptions  Pending Prescriptions Disp Refills   azithromycin (ZITHROMAX) 250 MG tablet [Pharmacy Med Name: AZITHROMYCIN 250 MG DOSE PACK] 6 tablet 0    Sig: Take two tablets on day 1, then one tablet daily x 4 days     Off-Protocol Failed - 01/04/2019  9:20 AM      Failed - Medication not assigned to a protocol, review manually.      Passed - Valid encounter within last 12 months    Recent Outpatient Visits          1 week ago Influenza A   [facility] [place], [place], [place]   2 weeks ago Acute recurrent sinusitis, unspecified location   [place], [place]   2 months ago Acute recurrent sinusitis, unspecified location   [place], [place]   3 months ago Elevated blood pressure reading   [place], [place]   4 months ago Nausea   [facility] [place], [place], [place]

## 2019-01-04 NOTE — Telephone Encounter (Signed)
Call placed to patient. Left VM to return call to discuss symptoms. Requested medication will be routed to office for review.

## 2019-01-25 ENCOUNTER — Ambulatory Visit (INDEPENDENT_AMBULATORY_CARE_PROVIDER_SITE_OTHER): Payer: Federal, State, Local not specified - PPO | Admitting: Physician Assistant

## 2019-01-25 ENCOUNTER — Encounter: Payer: Self-pay | Admitting: Physician Assistant

## 2019-01-25 VITALS — BP 150/100 | HR 101 | Temp 98.9°F | Ht 68.0 in | Wt 152.2 lb

## 2019-01-25 DIAGNOSIS — G4700 Insomnia, unspecified: Secondary | ICD-10-CM

## 2019-01-25 DIAGNOSIS — R030 Elevated blood-pressure reading, without diagnosis of hypertension: Secondary | ICD-10-CM | POA: Diagnosis not present

## 2019-01-25 MED ORDER — TRAZODONE HCL 50 MG PO TABS: 25.0000 mg | ORAL_TABLET | Freq: Every evening | ORAL | 0 refills | Status: DC | PRN

## 2019-01-25 NOTE — Progress Notes (Signed)
Catherine Cox is a 43 y.o. female is here to follow up from Flu.  I acted as a Education administrator for [employer], PA-C Anselmo Pickler, LPN  History of Present Illness:   Chief Complaint  Patient presents with  . follow up from Flu    HPI   Pt said she is here for follow up from the flu. Pt is feeling better. Denies cough, fever, chills or SOB. She has some intermittent nasal discharge, but nothing significant.  Currently not taking any blood pressure medications. At home blood pressure readings are: well controlled, per patient. Patient denies chest pain, SOB, blurred vision, dizziness, unusual headaches, lower leg swelling. Denies excessive caffeine intake, stimulant usage, excessive alcohol intake, or increase in salt consumption.  She is having issues with sleeping. Has taken [place] several years ago but it made her very groggy. Tries to have good sleep hygiene.     There are no preventive care reminders to display for this patient.  Past Medical History:  Diagnosis Date  . Environmental allergies   . Migraine headache    [facility] Dr[facility] Dom[facility], has been going since 2004     Social History   Socioeconomic History  . Marital status: Single    Spouse name: Not on file  . Number of children: Not on file  . Years of education: Not on file  . Highest education level: Not on file  Occupational History  . Not on file  Social Needs  . Financial resource strain: Not on file  . Food insecurity:    Worry: Not on file    Inability: Not on file  . Transportation needs:    Medical: Not on file    Non-medical: Not on file  Tobacco Use  . Smoking status: Never Smoker  . Smokeless tobacco: Never Used  Substance and Sexual Activity  . Alcohol use: Yes    Comment: occ  . Drug use: No  . Sexual activity: Yes    Birth control/protection: Injection  Lifestyle  . Physical activity:    Days per week: Not on file    Minutes per session: Not on file  . Stress:  Not on file  Relationships  . Social connections:    Talks on phone: Not on file    Gets together: Not on file    Attends religious service: Not on file    Active member of club or organization: Not on file    Attends meetings of clubs or organizations: Not on file    Relationship status: Not on file  . Intimate partner violence:    Fear of current or ex partner: Not on file    Emotionally abused: Not on file    Physically abused: Not on file    Forced sexual activity: Not on file  Other Topics Concern  . 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 TONSILLECTOMY      Family History  Problem Relation Age of Onset  . Alzheimer's disease Mother   . Cancer Father        lymphoma and lung  . Breast cancer Neg Hx   . Colon cancer Neg Hx     PMHx, SurgHx, SocialHx, FamHx, Medications, and Allergies were reviewed in the Visit Navigator and updated as appropriate.   Patient Active  Problem List   Diagnosis Date Noted  . Acute recurrent sinusitis 08/01/2017  . Migraine 11/22/2016    Social History   Tobacco Use  . Smoking status: Never Smoker  . Smokeless tobacco: Never Used  Substance Use Topics  . Alcohol use: Yes    Comment: occ  . Drug use: No    Current Medications and Allergies:    Current Outpatient Medications:  .  albuterol (PROVENTIL HFA;VENTOLIN HFA) 108 (90 Base) MCG/ACT inhaler, Inhale 1-2 puffs into the lungs every 4 (four) hours as needed for wheezing or shortness of breath., Disp: 8.5 Inhaler, Rfl: 1 .  budesonide-formoterol (SYMBICORT) 80-4.5 MCG/ACT inhaler, Inhale 2 puffs into the lungs 2 (two) times daily., Disp: 1 Inhaler, Rfl: 3 .  cyclobenzaprine (FLEXERIL) 10 MG tablet, Take 10 mg by mouth 3 (three) times daily as needed for muscle spasms., Disp: , Rfl:  .  medroxyPROGESTERone (DEPO-PROVERA) 150 MG/ML injection, Inject  150 mg into the muscle. Every 8-10 weeks. , Disp: , Rfl:  .  promethazine (PHENERGAN) 25 MG tablet, Take 1 tablet (25 mg total) by mouth every 6 (six) hours as needed for nausea or vomiting., Disp: 30 tablet, Rfl: 0 .  rizatriptan (MAXALT) 10 MG tablet, Take 10 mg by mouth as needed for migraine. May repeat in 2 hours if needed, Disp: , Rfl:  .  traZODone (DESYREL) 50 MG tablet, Take 0.5-1 tablets (25-50 mg total) by mouth at bedtime as needed for sleep., Disp: 30 tablet, Rfl: 0   Allergies  Allergen Reactions  . Codeine Anaphylaxis    Review of Systems   Review of Systems  Constitutional: Negative for chills, fever, malaise/fatigue and weight loss.  Respiratory: Negative for shortness of breath.   Cardiovascular: Negative for chest pain, orthopnea, claudication and leg swelling.  Gastrointestinal: Negative for heartburn, nausea and vomiting.  Neurological: Negative for dizziness, tingling and headaches.  Psychiatric/Behavioral: Negative for depression. The patient has insomnia. The patient is not nervous/anxious.     Vitals:   Vitals:   01/25/19 1250  BP: (!) 150/100  Pulse: (!) 101  Temp: 98.9 F (37.2 C)  TempSrc: Oral  SpO2: 97%  Weight: 152 lb 4 oz (69.1 kg)  Height: 5' 8"  (1.727 m)     Body mass index is 23.15 kg/m.   Physical Exam:    Physical Exam Vitals signs and nursing note reviewed.  Constitutional:      General: She is not in acute distress.    Appearance: She is well-developed. She is not ill-appearing or toxic-appearing.  Cardiovascular:     Rate and Rhythm: Normal rate and regular rhythm.     Pulses: Normal pulses.     Heart sounds: Normal heart sounds, S1 normal and S2 normal.     Comments: No LE edema Pulmonary:     Effort: Pulmonary effort is normal.     Breath sounds: Normal breath sounds.  Skin:    General: Skin is warm and dry.  Neurological:     Mental Status: She is alert.     GCS: GCS eye subscore is 4. GCS verbal subscore is 5. GCS  motor subscore is 6.  Psychiatric:        Speech: Speech normal.        Behavior: Behavior normal. Behavior is cooperative.      Assessment and Plan:    Catherine Cox was seen today for follow up from flu.  Diagnoses and all orders for this visit:  Elevated blood pressure reading Asymptomatic. Recommend  regular blood pressure checks and follow-up if symptoms develop or readings >140/90.   Insomnia, unspecified type Trazodone per orders. Follow-up if symptoms persist despite treatment.  Other orders -     traZODone (DESYREL) 50 MG tablet; Take 0.5-1 tablets (25-50 mg total) by mouth at bedtime as needed for sleep.  . Reviewed expectations re: course of current medical issues. . Discussed self-management of symptoms. . Outlined signs and symptoms indicating need for more acute intervention. . Patient verbalized understanding and all questions were answered. . See orders for this visit as documented in the electronic medical record. . Patient received an After Visit Summary.  CMA or LPN served as scribe during this visit. History, Physical, and Plan performed by medical provider. The above documentation has been reviewed and is accurate and complete.  [name], PA-C [place] [place] 01/25/2019  Follow-up: No follow-ups on file.

## 2019-01-25 NOTE — Patient Instructions (Signed)
It was great to see you!  May take Trazodone to help with sleep. If you don't like the side effects, stop taking it.  Take care,  [name] PA-C

## 2019-02-13 DIAGNOSIS — Z3042 Encounter for surveillance of injectable contraceptive: Secondary | ICD-10-CM | POA: Diagnosis not present

## 2019-02-15 ENCOUNTER — Other Ambulatory Visit: Payer: Self-pay | Admitting: Physician Assistant

## 2019-02-23 ENCOUNTER — Other Ambulatory Visit: Payer: Self-pay | Admitting: Physician Assistant

## 2019-02-23 NOTE — Telephone Encounter (Signed)
Ok to fill 

## 2019-03-22 ENCOUNTER — Other Ambulatory Visit: Payer: Self-pay | Admitting: Physician Assistant

## 2019-04-08 ENCOUNTER — Ambulatory Visit: Payer: Self-pay | Admitting: Physician Assistant

## 2019-04-08 ENCOUNTER — Ambulatory Visit (INDEPENDENT_AMBULATORY_CARE_PROVIDER_SITE_OTHER): Payer: Federal, State, Local not specified - PPO | Admitting: Family Medicine

## 2019-04-08 ENCOUNTER — Ambulatory Visit: Payer: Self-pay

## 2019-04-08 ENCOUNTER — Other Ambulatory Visit: Payer: Self-pay | Admitting: Obstetrics and Gynecology

## 2019-04-08 ENCOUNTER — Encounter: Payer: Self-pay | Admitting: Family Medicine

## 2019-04-08 ENCOUNTER — Telehealth: Payer: Self-pay | Admitting: *Deleted

## 2019-04-08 VITALS — BP 176/98

## 2019-04-08 DIAGNOSIS — Z3042 Encounter for surveillance of injectable contraceptive: Secondary | ICD-10-CM | POA: Diagnosis not present

## 2019-04-08 DIAGNOSIS — R030 Elevated blood-pressure reading, without diagnosis of hypertension: Secondary | ICD-10-CM

## 2019-04-08 MED ORDER — AMLODIPINE BESYLATE 5 MG PO TABS: 5.0000 mg | ORAL_TABLET | Freq: Every day | ORAL | 3 refills | Status: DC

## 2019-04-08 NOTE — Telephone Encounter (Signed)
Pts OB/GYN office RN Rise Paganini, called to say that they have Catherine Cox in there office today for a depo injection.  She came in stating that she has had a headache and has been to see her neurologist.  Today her BP 176/108, 168/98 and 177/122. Pt is taking no BP medication.  She has no other symptoms per [place]. Per protocol pt will go to ER for evaluation of her symptoms. OB/GYN will relay instruction to Catherine Beadnell. Not will be routed to office.  Reason for Disposition . [9] Systolic BP  >= 798 OR Diastolic >= 921 AND [1] cardiac or neurologic symptoms (e.g., chest pain, difficulty breathing, unsteady gait, blurred vision)  Answer Assessment - Initial Assessment Questions 1. BLOOD PRESSURE: "What is the blood pressure?" "Did you take at least two measurements 5 minutes apart?"     176/108,  168/98  177/122 2. ONSET: "When did you take your blood pressure?"    today 3. HOW: "How did you obtain the blood pressure?" (e.g., visiting nurse, automatic home BP monitor)     Nurse at Dr office 4. HISTORY: "Do you have a history of high blood pressure?"    Not on medications 5. MEDICATIONS: "Are you taking any medications for blood pressure?" "Have you missed any doses recently?"     No 6. OTHER SYMPTOMS: "Do you have any symptoms?" (e.g., headache, chest pain, blurred vision, difficulty breathing, weakness)     *headache 7. PREGNANCY: "Is there any chance you are pregnant?" "When was your last menstrual period?"    No  Protocols used: HIGH BLOOD PRESSURE-A-AH

## 2019-04-08 NOTE — Progress Notes (Signed)
    Chief Complaint:  Catherine Cox is a 43 y.o. female who presents today for a virtual office visit with a chief complaint of elevated BP reading.   Assessment/Plan:  Elevated BP Reading Blood pressure well above goal.  Discussed treatment options.  She does not want to do long-term medication.  Discussed importance of preventing long-term complications.  We will start amlodipine 5 mg daily.  Discussed potential side effects.  Advised her to follow-up with me or her PCP in 1 to 2 weeks.  Discussed reasons to return to care and seek urgent care.  Discussed importance of regular exercise and low-sodium diet.    Subjective:  HPI:  Elevated BP Reading  Patient with several month history of intermittent elevated BP readings. She was seen today at her OB/GYN office for Depo injection found to have blood pressure readings into the 170s over 100s.  She was instructed to go to the ER.  Currently does not have any chest pain or shortness of breath.  Has had elevated blood pressures in the past that she tried to manage via diet and exercise alone.  She has a mild headache however this is normal for her.  She has never been on anything for blood pressure in the past.  ROS: Per HPI  PMH: She reports that she has never smoked. She has never used smokeless tobacco. She reports current alcohol use. She reports that she does not use drugs.      Objective/Observations  Physical Exam: BP (!) 176/98 (BP Location: Left Arm, Patient Position: Sitting, Cuff Size: Large)  Gen: NAD, resting comfortably Pulm: Normal work of breathing Neuro: Grossly normal, moves all extremities Psych: Normal affect and thought content  Virtual Visit via Video   I connected with Catherine Cox on 04/08/19 at  3:40 PM EDT by a video enabled telemedicine application and verified that I am speaking with the correct person using two identifiers. I discussed the limitations of evaluation and management by telemedicine and the  availability of in person appointments. The patient expressed understanding and agreed to proceed.   Patient location: Home Provider location: [place] participating in the virtual visit: Myself and Patient     Algis Greenhouse. Jerline Pain, MD 04/08/2019 3:34 PM

## 2019-04-08 NOTE — Telephone Encounter (Signed)
See note

## 2019-04-08 NOTE — Telephone Encounter (Signed)
FYI, see message. 

## 2019-04-08 NOTE — Telephone Encounter (Signed)
Spoke to pt  Refusing to go to ED. Told pt can offer her an appt today at 3:40 with Dr. Jerline Pain or an appt tomorrow morning with [facility]. Pt said she will see Dr. Jerline Pain. Okay appt scheduled for 3:40. Pt verbalized understanding.

## 2019-04-08 NOTE — Telephone Encounter (Signed)
Pt called in c/o her BP being elevated and having a headache.   She went to the OB-GYN today for her Depo shot and her BP was elevated.   She was also seen by her neurologist and her BP was elevated there too today. She called in at 14:30 today and was triaged for the elevated BP by Fransisca Connors, RN and was instructed to go to the ED while she was still at the OB-GYN's office.  I asked her if she had been to the ED and she responded,   "I'm not going to the emergency room".   [name] is aware of my BP being high.   It's been that way for 6 months.   "She just needs to prescribe me a mild BP medication".   I let her know she would need to do a virtual visit in order to be prescribed BP medication. I spoke with Madison at [facility] office and told her the situation that pt has been advised to go to the ED but she is refusing. Madison checked with [name]'s nurse Butch Penny who also said she needed to go to the ED. I let the pt know this however she is refusing to go to the ED.   "Can't she just prescribe me something for my BP?"   "I have a young child and I'm not going to the emergency room and be exposed to the coronavirus".  I let her know that those pts are kept separated from other pts.   She still refused to go to the ED and was insisting she wanted [name] to prescribe her something for the BP.  I called the office back and got Miller again.   She said she would get [name]'s nurse Butch Penny to talk with the pt when I told her Ms. Tribby was not taking no for an answer from me and refusing the ED.  When the pt was connected with the office staff and I made sure they were connected I got off of the line at that point.  I sent these notes to the [facility] office for [place], [place].  Reason for Disposition . [6] Systolic BP  >= 761 OR Diastolic >= 950 AND [9] cardiac or neurologic symptoms (e.g., chest pain, difficulty breathing, unsteady gait, blurred vision)    Pt just  triaged by one of the other [place] RNs for elevated BP.   Told to go to the ED.   She is refusing and has called back.  So triage not done.   See notes from Fransisca Connors, Therapist, sports.  Answer Assessment - Initial Assessment Questions 1. BLOOD PRESSURE: "What is the blood pressure?" "Did you take at least two measurements 5 minutes apart?"     Pt was just triaged by one of our [place] nurses. 2. ONSET: "When did you take your blood pressure?"     *No Answer* 3. HOW: "How did you obtain the blood pressure?" (e.g., visiting nurse, automatic home BP monitor)     *No Answer* 4. HISTORY: "Do you have a history of high blood pressure?"     *No Answer* 5. MEDICATIONS: "Are you taking any medications for blood pressure?" "Have you missed any doses recently?"     *No Answer* 6. OTHER SYMPTOMS: "Do you have any symptoms?" (e.g., headache, chest pain, blurred vision, difficulty breathing, weakness)     *No Answer* 7. PREGNANCY: "Is there any chance you are pregnant?" "When was your last menstrual period?"     *  No Answer*  Protocols used: HIGH BLOOD PRESSURE-A-AH

## 2019-04-08 NOTE — Telephone Encounter (Signed)
Pt scheduled to see Dr. Jerline Pain today.

## 2019-04-22 ENCOUNTER — Telehealth: Payer: Self-pay | Admitting: Physician Assistant

## 2019-04-22 NOTE — Telephone Encounter (Signed)
Spoke to pt told her letter is ready for pick up and in regards to records you will need to contact Medical Records for office visits you want. Pt verbalized understanding.

## 2019-04-22 NOTE — Telephone Encounter (Signed)
We can provide the work note however we cannot provider her with the office visit notes, I believe that this has to go through medical records.

## 2019-04-22 NOTE — Telephone Encounter (Signed)
See note  Copied from [place] [id number]. Topic: General - Other >> Apr 22, 2019 10:58 AM Rayann Heman wrote: Reason for CRM: pt called and stated that she would like a note that stated that she has been having high blood pressure for the last year. The note would need to include BP reading. Pt states that she would need this note for her employer. She would also need the all notes from the last year to be printed do that she can give them to her work. Please advise

## 2019-04-22 NOTE — Telephone Encounter (Signed)
Please see message. °

## 2019-05-10 DIAGNOSIS — Z1231 Encounter for screening mammogram for malignant neoplasm of breast: Secondary | ICD-10-CM | POA: Diagnosis not present

## 2019-05-16 ENCOUNTER — Telehealth: Payer: Self-pay | Admitting: Physician Assistant

## 2019-05-16 NOTE — Telephone Encounter (Signed)
Please see message and advise 

## 2019-05-16 NOTE — Telephone Encounter (Signed)
Copied from [place] [id number]. Topic: General - Other >> May 16, 2019  7:15 AM Lennox Solders wrote: Reason for CRM:pt is calling and dr Jerline Pain put her on bp amlodipine 5 mg. Pt is having stomach cramps and she google the medication and its one of the side effects. Please advice

## 2019-05-16 NOTE — Telephone Encounter (Signed)
Needs virtual visit

## 2019-05-16 NOTE — Telephone Encounter (Signed)
Called and spoke to pt, told her [name] would like her to schedule virtual visit today. Pt said she can not today but can tomorrow. Appt scheduled for 6/11 at 2:00 pm tomorrow with [name]. Pt verbalized understanding.

## 2019-05-17 ENCOUNTER — Encounter: Payer: Self-pay | Admitting: Physician Assistant

## 2019-05-17 ENCOUNTER — Ambulatory Visit (INDEPENDENT_AMBULATORY_CARE_PROVIDER_SITE_OTHER): Payer: Federal, State, Local not specified - PPO | Admitting: Physician Assistant

## 2019-05-17 VITALS — Ht 68.0 in | Wt 158.0 lb

## 2019-05-17 DIAGNOSIS — J0191 Acute recurrent sinusitis, unspecified: Secondary | ICD-10-CM | POA: Diagnosis not present

## 2019-05-17 DIAGNOSIS — R030 Elevated blood-pressure reading, without diagnosis of hypertension: Secondary | ICD-10-CM

## 2019-05-17 MED ORDER — CEFUROXIME AXETIL 500 MG PO TABS: 500.0000 mg | ORAL_TABLET | Freq: Two times a day (BID) | ORAL | 0 refills | Status: AC

## 2019-05-17 MED ORDER — LISINOPRIL 5 MG PO TABS: 5.0000 mg | ORAL_TABLET | Freq: Every day | ORAL | 0 refills | Status: DC

## 2019-05-17 NOTE — Progress Notes (Signed)
Virtual Visit via Video   I connected with Catherine Cox on 05/17/19 at  3:00 PM EDT by a video enabled telemedicine application and verified that I am speaking with the correct person using two identifiers. Location patient: Home Location provider: [facility], Office Persons participating in the virtual visit: Catherine Cox, Catherine Coke PA-C, Anselmo Pickler, LPN   I discussed the limitations of evaluation and management by telemedicine and the availability of in person appointments. The patient expressed understanding and agreed to proceed.  I acted as a Education administrator for [employer], PA-C [employer], LPN  Subjective:   HPI:   Medication problem Pt c/o having stomach cramps since she was put on Amlodipine 5 mg for blood pressure by Dr. Jerline Pain at 5/4 visit. The cramps start 30-45 minutes after taking medication. Had headache yesterday and took Maxalt with relief. Pt denies dizziness, blurred vision, chest pain, SOB or lower leg edema. Pt is having some nasal congestion, light yellow drainage with slight blood this morning. Also having a lot of post nasal drip. Denies excessive caffeine intake, stimulant usage, excessive alcohol intake or increase in salt consumption.   ROS: See pertinent positives and negatives per HPI.  Patient Active Problem List   Diagnosis Date Noted  . Acute recurrent sinusitis 08/01/2017  . Migraine 11/22/2016    Social History   Tobacco Use  . Smoking status: Never Smoker  . Smokeless tobacco: Never Used  Substance Use Topics  . Alcohol use: Yes    Comment: occ    Current Outpatient Medications:  .  albuterol (PROVENTIL HFA;VENTOLIN HFA) 108 (90 Base) MCG/ACT inhaler, Inhale 1-2 puffs into the lungs every 4 (four) hours as needed for wheezing or shortness of breath., Disp: 8.5 Inhaler, Rfl: 1 .  amLODipine (NORVASC) 5 MG tablet, Take 1 tablet (5 mg total) by mouth daily., Disp: 90 tablet, Rfl: 3 .  budesonide-formoterol (SYMBICORT) 80-4.5  MCG/ACT inhaler, TAKE 2 PUFFS BY MOUTH TWICE A DAY, Disp: 10.2 Inhaler, Rfl: 3 .  cyclobenzaprine (FLEXERIL) 10 MG tablet, Take 10 mg by mouth 3 (three) times daily as needed for muscle spasms., Disp: , Rfl:  .  medroxyPROGESTERone (DEPO-PROVERA) 150 MG/ML injection, Inject 150 mg into the muscle. Every 8-10 weeks. , Disp: , Rfl:  .  promethazine (PHENERGAN) 25 MG tablet, Take 1 tablet (25 mg total) by mouth every 6 (six) hours as needed for nausea or vomiting., Disp: 30 tablet, Rfl: 0 .  rizatriptan (MAXALT) 10 MG tablet, Take 10 mg by mouth as needed for migraine. May repeat in 2 hours if needed, Disp: , Rfl:  .  traZODone (DESYREL) 50 MG tablet, TAKE 0.5-1 TABLETS (25-50 MG TOTAL) BY MOUTH AT BEDTIME AS NEEDED FOR SLEEP., Disp: 30 tablet, Rfl: 0 .  cefUROXime (CEFTIN) 500 MG tablet, Take 1 tablet (500 mg total) by mouth 2 (two) times daily with a meal for 7 days., Disp: 14 tablet, Rfl: 0 .  lisinopril (ZESTRIL) 5 MG tablet, Take 1 tablet (5 mg total) by mouth daily., Disp: 30 tablet, Rfl: 0  Allergies  Allergen Reactions  . Codeine Anaphylaxis    Objective:   VITALS: Per patient if applicable, see vitals. GENERAL: Alert, appears well and in no acute distress. HEENT: Atraumatic, conjunctiva clear, no obvious abnormalities on inspection of external nose and ears. NECK: Normal movements of the head and neck. CARDIOPULMONARY: No increased WOB. Speaking in clear sentences. I:E ratio WNL.  MS: Moves all visible extremities without noticeable abnormality. PSYCH: Pleasant  and cooperative, well-groomed. Speech normal rate and rhythm. Affect is appropriate. Insight and judgement are appropriate. Attention is focused, linear, and appropriate.  NEURO: CN grossly intact. Oriented as arrived to appointment on time with no prompting. Moves both UE equally.  SKIN: No obvious lesions, wounds, erythema, or cyanosis noted on face or hands.  Assessment and Plan:   [name] was seen today for medication  problem.  Diagnoses and all orders for this visit:  Elevated blood pressure reading Although her blood pressures are relatively well controlled, she would like to trial a different, low-dose medication to see if she can tolerate this better. Stop Norvasc 5 mg. Start Lisinopril 5 mg. Monitor BP, keep log. Follow-up in 1 month, sooner if any concerns.  Acute recurrent sinusitis, unspecified location No red flags on discussion. Suspect very early sinusitis. Will initiate ceftin per orders. Discussed taking medications as prescribed. Reviewed return precautions including worsening fever, SOB, worsening cough or other concerns. Push fluids and rest. I recommend that patient follow-up if symptoms worsen or persist despite treatment x 7-10 days, sooner if needed.  Other orders -     cefUROXime (CEFTIN) 500 MG tablet; Take 1 tablet (500 mg total) by mouth 2 (two) times daily with a meal for 7 days. -     lisinopril (ZESTRIL) 5 MG tablet; Take 1 tablet (5 mg total) by mouth daily.    . Reviewed expectations re: course of current medical issues. . Discussed self-management of symptoms. . Outlined signs and symptoms indicating need for more acute intervention. . Patient verbalized understanding and all questions were answered. [name] Health Maintenance issues including appropriate healthy diet, exercise, and smoking avoidance were discussed with patient. . See orders for this visit as documented in the electronic medical record.  I discussed the assessment and treatment plan with the patient. The patient was provided an opportunity to ask questions and all were answered. The patient agreed with the plan and demonstrated an understanding of the instructions.   The patient was advised to call back or seek an in-person evaluation if the symptoms worsen or if the condition fails to improve as anticipated.   CMA or LPN served as scribe during this visit. History, Physical, and Plan performed by medical  provider. The above documentation has been reviewed and is accurate and complete.   [place], [place] 05/17/2019

## 2019-05-22 ENCOUNTER — Other Ambulatory Visit: Payer: Self-pay | Admitting: Physician Assistant

## 2019-06-06 ENCOUNTER — Encounter: Payer: Self-pay | Admitting: Physician Assistant

## 2019-06-06 ENCOUNTER — Ambulatory Visit (INDEPENDENT_AMBULATORY_CARE_PROVIDER_SITE_OTHER): Payer: Federal, State, Local not specified - PPO | Admitting: Physician Assistant

## 2019-06-06 VITALS — Ht 68.0 in

## 2019-06-06 DIAGNOSIS — R030 Elevated blood-pressure reading, without diagnosis of hypertension: Secondary | ICD-10-CM | POA: Diagnosis not present

## 2019-06-06 DIAGNOSIS — G4700 Insomnia, unspecified: Secondary | ICD-10-CM

## 2019-06-06 DIAGNOSIS — Z3042 Encounter for surveillance of injectable contraceptive: Secondary | ICD-10-CM | POA: Diagnosis not present

## 2019-06-06 MED ORDER — HYDROXYZINE HCL 25 MG PO TABS: 25.0000 mg | ORAL_TABLET | Freq: Every evening | ORAL | 0 refills | Status: DC | PRN

## 2019-06-06 MED ORDER — LISINOPRIL 5 MG PO TABS: 5.0000 mg | ORAL_TABLET | Freq: Every day | ORAL | 1 refills | Status: DC

## 2019-06-06 NOTE — Progress Notes (Signed)
TELEPHONE ENCOUNTER   Patient verbally agreed to telephone visit and is aware that copayment and coinsurance may apply. Patient was treated using telemedicine according to accepted telemedicine protocols.  Location of the patient: home Location of provider: [place] office Names of all persons participating in the telemedicine service and role in the encounter: Catherine Cox, [place] , Catherine Cox  Subjective:   Chief Complaint  Patient presents with  . hypertention follow up     HPI   HTN Currently taking Lisinopril 5 mg. At home blood pressure readings are: not being checked. Patient denies chest pain, SOB, blurred vision, dizziness, unusual headaches, lower leg swelling. Patient is compliant with medication. Denies excessive caffeine intake, stimulant usage, excessive alcohol intake, or increase in salt consumption.  Insomnia Trazodone is making her "feel weird." She is having fatigue and thinks that it is because she is not able to stay asleep. Would like to try new rx if possible.  Patient Active Problem List   Diagnosis Date Noted  . Acute recurrent sinusitis 08/01/2017  . Migraine 11/22/2016   Social History   Tobacco Use  . Smoking status: Never Smoker  . Smokeless tobacco: Never Used  Substance Use Topics  . Alcohol use: Yes    Comment: occ    Current Outpatient Medications:  .  albuterol (PROVENTIL HFA;VENTOLIN HFA) 108 (90 Base) MCG/ACT inhaler, Inhale 1-2 puffs into the lungs every 4 (four) hours as needed for wheezing or shortness of breath., Disp: 8.5 Inhaler, Rfl: 1 .  budesonide-formoterol (SYMBICORT) 80-4.5 MCG/ACT inhaler, TAKE 2 PUFFS BY MOUTH TWICE A DAY, Disp: 30.6 Inhaler, Rfl: 1 .  cyclobenzaprine (FLEXERIL) 10 MG tablet, Take 10 mg by mouth 3 (three) times daily as needed for muscle spasms., Disp: , Rfl:  .  lisinopril (ZESTRIL) 5 MG tablet, Take 1 tablet (5 mg total) by mouth daily., Disp: 90 tablet, Rfl: 1 .  medroxyPROGESTERone  (DEPO-PROVERA) 150 MG/ML injection, Inject 150 mg into the muscle. Every 8-10 weeks. , Disp: , Rfl:  .  promethazine (PHENERGAN) 25 MG tablet, Take 1 tablet (25 mg total) by mouth every 6 (six) hours as needed for nausea or vomiting., Disp: 30 tablet, Rfl: 0 .  rizatriptan (MAXALT) 10 MG tablet, Take 10 mg by mouth as needed for migraine. May repeat in 2 hours if needed, Disp: , Rfl:  .  hydrOXYzine (ATARAX/VISTARIL) 25 MG tablet, Take 1 tablet (25 mg total) by mouth at bedtime as needed for anxiety (insomnia). Take one 25 mg tablet 30-60 minutes prior to bedtime for insomnia, anxiety. May increase to two tablets., Disp: 60 tablet, Rfl: 0 Allergies  Allergen Reactions  . Codeine Anaphylaxis    Assessment & Plan:   1. Elevated blood pressure reading  States that BP is well-controlled. Recommended regular checking of BP. Will continue current regimen of Lisinopril 5 mg daily x 6 months. Follow-up sooner if needed.  2. Insomnia, unspecified type  Uncontrolled. Stop Trazodone. Trial atarax. Follow-up prn.    No orders of the defined types were placed in this encounter.  Meds ordered this encounter  Medications  . lisinopril (ZESTRIL) 5 MG tablet    Sig: Take 1 tablet (5 mg total) by mouth daily.    Dispense:  90 tablet    Refill:  1    Order Specific Question:   Supervising Provider    Answer:   Juleen China, ERICA [[id number]]  . hydrOXYzine (ATARAX/VISTARIL) 25 MG tablet    Sig: Take 1 tablet (25  mg total) by mouth at bedtime as needed for anxiety (insomnia). Take one 25 mg tablet 30-60 minutes prior to bedtime for insomnia, anxiety. May increase to two tablets.    Dispense:  60 tablet    Refill:  0    Order Specific Question:   Supervising Provider    Answer:   Juleen China, ERICA [name], PA 06/06/2019  Time spent with the patient: 9 minutes, spent in obtaining information about her symptoms, reviewing her previous labs, evaluations, and treatments, counseling her about her  condition (please see the discussed topics above), and developing a plan to further investigate it; she had a number of questions which I addressed.

## 2019-06-06 NOTE — Patient Instructions (Signed)
Health Maintenance Due  Topic Date Due  . MAMMOGRAM  05/02/2019    Depression screen [facility] 2/9 08/10/2018 07/20/2017  Decreased Interest 0 0  Down, Depressed, Hopeless 0 0  PHQ - 2 Score 0 0  Altered sleeping 3 -  Tired, decreased energy 0 -  Change in appetite 0 -  Feeling bad or failure about yourself  0 -  Trouble concentrating 0 -  Moving slowly or fidgety/restless 0 -  Suicidal thoughts 0 -  PHQ-9 Score 3 -  Difficult doing work/chores Not difficult at all -

## 2019-06-10 ENCOUNTER — Telehealth: Payer: Self-pay | Admitting: Physician Assistant

## 2019-06-10 NOTE — Telephone Encounter (Signed)
Copied from [place] [id number]. Topic: General - Other >> Jun 10, 2019  2:51 PM Leward Quan A wrote: Reason for CRM: Patient called to request a call back from [facility] in reference to her BP medication states that they may need to be stronger because her BP is still high   Ph# [phone]

## 2019-06-10 NOTE — Telephone Encounter (Signed)
Please see message and advise 

## 2019-06-10 NOTE — Telephone Encounter (Signed)
What are avg blood pressures running?  Symptoms?

## 2019-06-11 ENCOUNTER — Other Ambulatory Visit: Payer: Self-pay | Admitting: Physician Assistant

## 2019-06-11 MED ORDER — LISINOPRIL 10 MG PO TABS: 10.0000 mg | ORAL_TABLET | Freq: Every day | ORAL | 0 refills | Status: DC

## 2019-06-11 NOTE — Telephone Encounter (Signed)
See message.

## 2019-06-11 NOTE — Telephone Encounter (Signed)
Spoke to pt told her [name] has sent in Lisinopril 10 mg to start now. Follow up in 2 weeks IN OFFICE sooner if symptoms develop. Keep Bp log everyday and bring with you. Pt verbalized understanding and wanted to know what signs to look for? Told her headaches, dizziness, lightheadedness, chest pain, SOB, leg edema. Pt verbalized understanding.

## 2019-06-11 NOTE — Telephone Encounter (Signed)
Pt states she is not having sx, but when she went to her OBGYN last Friday, her bp was  154/100. Pt OB suggested she call her pcp and discuss increase. Pt states she does not take her bp everyday, and will wait a little longer if you think she should.

## 2019-06-11 NOTE — Telephone Encounter (Signed)
I have sent in Lisinopril 10 mg. I would like to start this now.  Follow-up with me IN OFFICE in two weeks. Sooner if symptoms develop. Keep BP log in meantime.

## 2019-06-18 NOTE — Telephone Encounter (Signed)
Pt called to speak with Butch Penny. Would like a callback as soon as possible regarding BP and work accommodations. FC line rang busy x3.

## 2019-06-18 NOTE — Telephone Encounter (Signed)
Please see below.

## 2019-06-19 NOTE — Telephone Encounter (Signed)
Left message on voicemail to call office.

## 2019-06-20 NOTE — Telephone Encounter (Signed)
Pt called back, said she would like to discuss accommodations for job. Told her when she comes back in for follow up appt can discuss it then with [facility]. Pt verbalized understanding.

## 2019-07-03 ENCOUNTER — Encounter: Payer: Self-pay | Admitting: Physician Assistant

## 2019-07-03 ENCOUNTER — Other Ambulatory Visit: Payer: Self-pay

## 2019-07-03 ENCOUNTER — Other Ambulatory Visit: Payer: Self-pay | Admitting: Physician Assistant

## 2019-07-03 ENCOUNTER — Ambulatory Visit (INDEPENDENT_AMBULATORY_CARE_PROVIDER_SITE_OTHER): Payer: Federal, State, Local not specified - PPO | Admitting: Physician Assistant

## 2019-07-03 VITALS — BP 150/110 | HR 101 | Temp 97.9°F | Ht 68.0 in | Wt 156.0 lb

## 2019-07-03 DIAGNOSIS — F439 Reaction to severe stress, unspecified: Secondary | ICD-10-CM

## 2019-07-03 DIAGNOSIS — I10 Essential (primary) hypertension: Secondary | ICD-10-CM

## 2019-07-03 MED ORDER — LISINOPRIL 20 MG PO TABS: 20.0000 mg | ORAL_TABLET | Freq: Every day | ORAL | 1 refills | Status: DC

## 2019-07-03 NOTE — Patient Instructions (Signed)
It was great to see you!  Stop Lisinopril 10 mg.  Start Lisinopril 20 mg.  Work on stress reduction as able :)  Let's follow-up in 1 month, sooner if you have concerns.  Take care,  [name] PA-C

## 2019-07-03 NOTE — Progress Notes (Signed)
Catherine Cox is a 43 y.o. female is here to follow up on Hypertension.  I acted as a Education administrator for [employer], PA-C Anselmo Pickler, LPN  History of Present Illness:   Chief Complaint  Patient presents with  . Hypertension    HPI  Hypertension Pt here for follow up on blood pressure. Medication was increased to Lisinopril 10 mg daily on 7/2. She has not been checking blood pressure. Pt had a headache the other day but thinks that is due to weather, job stress and caregiver stress (mom recently had stroke). Took Excedrin with relief. She woke up with a headache this morning took Excedrin and is subsiding. Pt denies dizziness, blurred vision, chest pain, SOB or lower leg edema. Denies excessive caffeine intake, stimulant usage, excessive alcohol intake or increase in salt consumption.   Health Maintenance Due  Topic Date Due  . MAMMOGRAM  05/02/2019    Past Medical History:  Diagnosis Date  . Environmental allergies   . Migraine headache    [facility] -- Dr. Domingo Cocking, has been going since 2004     Social History   Socioeconomic History  . Marital status: Single    Spouse name: Not on file  . Number of children: Not on file  . Years of education: Not on file  . Highest education level: Not on file  Occupational History  . Not on file  Social Needs  . Financial resource strain: Not on file  . Food insecurity    Worry: Not on file    Inability: Not on file  . Transportation needs    Medical: Not on file    Non-medical: Not on file  Tobacco Use  . Smoking status: Never Smoker  . Smokeless tobacco: Never Used  Substance and Sexual Activity  . Alcohol use: Yes    Comment: occ  . Drug use: No  . Sexual activity: Yes    Birth control/protection: Injection  Lifestyle  . Physical activity    Days per week: Not on file    Minutes per session: Not on file  . Stress: Not on file  Relationships  . Social Herbalist on phone: Not on file   Gets together: Not on file    Attends religious service: Not on file    Active member of club or organization: Not on file    Attends meetings of clubs or organizations: Not on file    Relationship status: Not on file  . Intimate partner violence    Fear of current or ex partner: Not on file    Emotionally abused: Not on file    Physically abused: Not on file    Forced sexual activity: Not on file  Other Topics Concern  . Not on file  Social History Narrative   Research scientist (medical) with [employer]   2 chlidren - 36 and 45 y/o   Single    Past Surgical History:  Procedure Laterality Date  . TONSILLECTOMY      Family History  Problem Relation Age of Onset  . Alzheimer's disease Mother   . Cancer Father        lymphoma and lung  . Breast cancer Neg Hx   . Colon cancer Neg Hx     PMHx, SurgHx, SocialHx, FamHx, Medications, and Allergies were reviewed in the Visit Navigator and updated as appropriate.   Patient Active Problem List   Diagnosis Date Noted  . Acute recurrent sinusitis 08/01/2017  .  Migraine 11/22/2016    Social History   Tobacco Use  . Smoking status: Never Smoker  . Smokeless tobacco: Never Used  Substance Use Topics  . Alcohol use: Yes    Comment: occ  . Drug use: No    Current Medications and Allergies:    Current Outpatient Medications:  .  albuterol (PROVENTIL HFA;VENTOLIN HFA) 108 (90 Base) MCG/ACT inhaler, Inhale 1-2 puffs into the lungs every 4 (four) hours as needed for wheezing or shortness of breath., Disp: 8.5 Inhaler, Rfl: 1 .  budesonide-formoterol (SYMBICORT) 80-4.5 MCG/ACT inhaler, TAKE 2 PUFFS BY MOUTH TWICE A DAY, Disp: 30.6 Inhaler, Rfl: 1 .  cyclobenzaprine (FLEXERIL) 10 MG tablet, Take 10 mg by mouth 3 (three) times daily as needed for muscle spasms., Disp: , Rfl:  .  hydrOXYzine (ATARAX/VISTARIL) 25 MG tablet, Take 1 tablet (25 mg total) by mouth at bedtime as needed for anxiety (insomnia). Take one 25 mg tablet 30-60 minutes  prior to bedtime for insomnia, anxiety. May increase to two tablets., Disp: 60 tablet, Rfl: 0 .  medroxyPROGESTERone (DEPO-PROVERA) 150 MG/ML injection, Inject 150 mg into the muscle. Every 8-10 weeks. , Disp: , Rfl:  .  promethazine (PHENERGAN) 25 MG tablet, Take 1 tablet (25 mg total) by mouth every 6 (six) hours as needed for nausea or vomiting., Disp: 30 tablet, Rfl: 0 .  rizatriptan (MAXALT) 10 MG tablet, Take 10 mg by mouth as needed for migraine. May repeat in 2 hours if needed, Disp: , Rfl:  .  lisinopril (ZESTRIL) 20 MG tablet, Take 1 tablet (20 mg total) by mouth daily., Disp: 30 tablet, Rfl: 1   Allergies  Allergen Reactions  . Codeine Anaphylaxis    Review of Systems   ROS  Negative unless otherwise specified per HPI.  Vitals:   Vitals:   07/03/19 1113 07/03/19 1130  BP: (!) 160/110 (!) 150/110  Pulse: (!) 101 (!) 101  Temp: 97.9 F (36.6 C)   TempSrc: Temporal   SpO2: 98%   Weight: 156 lb (70.8 kg)   Height: 5' 8"  (1.727 m)      Body mass index is 23.72 kg/m.   Physical Exam:    Physical Exam Vitals signs and nursing note reviewed.  Constitutional:      General: She is not in acute distress.    Appearance: She is well-developed. She is not ill-appearing or toxic-appearing.  Cardiovascular:     Rate and Rhythm: Regular rhythm. Tachycardia present.     Pulses: Normal pulses.     Heart sounds: Normal heart sounds, S1 normal and S2 normal.     Comments: No LE edema Pulmonary:     Effort: Pulmonary effort is normal.     Breath sounds: Normal breath sounds.  Skin:    General: Skin is warm and dry.  Neurological:     Mental Status: She is alert.     GCS: GCS eye subscore is 4. GCS verbal subscore is 5. GCS motor subscore is 6.  Psychiatric:        Speech: Speech normal.        Behavior: Behavior normal. Behavior is cooperative.      Assessment and Plan:    Catherine Cox was seen today for hypertension.  Diagnoses and all orders for this visit:   Hypertension, unspecified type Uncontrolled. Increase Lisinopril to 20 mg daily. Follow-up in 1 month, sooner if concerns. Reviewed signs/symptoms that would warrant ER evaluation.  Situational stress Did provide work note today to see if  she can work closer to home as she currently [place] to [employer], a total of 3 hours daily, and this is greatly impacting her life and health. Denies any SI/HI or need for further intervention at this time.  Other orders -     lisinopril (ZESTRIL) 20 MG tablet; Take 1 tablet (20 mg total) by mouth daily.    . Reviewed expectations re: course of current medical issues. . Discussed self-management of symptoms. . Outlined signs and symptoms indicating need for more acute intervention. . Patient verbalized understanding and all questions were answered. . See orders for this visit as documented in the electronic medical record. . Patient received an After Visit Summary.  CMA or LPN served as scribe during this visit. History, Physical, and Plan performed by medical provider. The above documentation has been reviewed and is accurate and complete.   [name], PA-C [place] [place] 07/03/2019  Follow-up: No follow-ups on file.

## 2019-07-11 ENCOUNTER — Telehealth: Payer: Self-pay

## 2019-07-11 NOTE — Telephone Encounter (Signed)
Copied from [place] [id number]. Topic: General - Other >> Jul 11, 2019 10:52 AM Lennox Solders wrote: Reason for CRM:pt is calling checking on paper work she  fax personally  to [facility] . Pt would like confirmation the office received the paperwork

## 2019-07-11 NOTE — Telephone Encounter (Signed)
Spoke to pt told her did receive the paper work for [employer]. Unfortunately we can not e-mail it back to you, will need to be picked up. As soon as it is ready I will contact you. Pt verbalized understanding.

## 2019-07-15 NOTE — Telephone Encounter (Signed)
Catherine Cox emailed paperwork to pt on 07/12/2019.

## 2019-07-25 ENCOUNTER — Ambulatory Visit (INDEPENDENT_AMBULATORY_CARE_PROVIDER_SITE_OTHER): Payer: Federal, State, Local not specified - PPO | Admitting: Family Medicine

## 2019-07-25 ENCOUNTER — Other Ambulatory Visit: Payer: Self-pay

## 2019-07-25 ENCOUNTER — Encounter: Payer: Self-pay | Admitting: Family Medicine

## 2019-07-25 VITALS — BP 158/110 | HR 110 | Temp 98.1°F | Resp 16 | Ht 68.0 in | Wt 162.2 lb

## 2019-07-25 DIAGNOSIS — F430 Acute stress reaction: Secondary | ICD-10-CM | POA: Diagnosis not present

## 2019-07-25 DIAGNOSIS — G44209 Tension-type headache, unspecified, not intractable: Secondary | ICD-10-CM

## 2019-07-25 DIAGNOSIS — I10 Essential (primary) hypertension: Secondary | ICD-10-CM | POA: Diagnosis not present

## 2019-07-25 MED ORDER — LISINOPRIL 20 MG PO TABS: 20.0000 mg | ORAL_TABLET | Freq: Every day | ORAL | 1 refills | Status: DC

## 2019-07-25 MED ORDER — PROMETHAZINE HCL 25 MG PO TABS: 25.0000 mg | ORAL_TABLET | Freq: Four times a day (QID) | ORAL | 0 refills | Status: DC | PRN

## 2019-07-25 NOTE — Progress Notes (Signed)
Subjective  CC:  Chief Complaint  Patient presents with   Hypertension    Thinks its due to stress, has been taking medication.[name] Has been under stress lately   Same day acute visit; PCP not available. New pt to me. Chart reviewed.   HPI: Catherine Cox is a 44 y.o. female who presents to the office today to address the problems listed above in the chief complaint.  Hypertension f/u: 43 year old female hypertension, recently here for medication adjustment.  Reviewed chart.  She runs elevated blood pressures especially with headaches or acute illnesses.  She reports that she is taking lisinopril 20 mg daily.  Has not been checking home blood pressures because her cough is now broken.  However she has been very stressed due to stressful work environment.  She works in the [employer].  She is taking 2 days off of work to try to get things feeling better, however yesterday she had abdominal cramping and nausea and vomiting.  No fevers, chills or diarrhea.  No abdominal pain.  Today, she feels fine.  No nausea.  No cramping.  Eat normally.  Mother, she has a tension headache.  Blood pressure yesterday and today have been elevated.  No chest pain, shortness of breath or palpitations.  Physical was done last year in October, normal CBC and CMP and TSH at that time.  No lipid panel.  She denies mood disorder or history of generalized anxiety.  Assessment  1. Essential hypertension   2. Tension headache   3. Stress reaction      Plan    Hypertension f/u: Blood pressure is elevated today but could be related to tension headache.  Elevated blood pressure in the setting of nausea and vomiting is also hard to interpret.  No medication changes done today.  Patient did report the blood pressures were 120s over 70s prior to her blood pressure cuff breaking.  I recommend getting a new cuff and starting to recheck.  We discussed stress management, anxiety follow-up and need for follow-up here in the the  next 3 weeks her primary care doctor.  If blood pressures remain elevated, further medication adjustments can be made.  Anxiety f/u: Patient with work stress.  Educated on stress related symptoms including headaches and abdominal cramping.  Patient will monitor her symptoms.  If they worsen, she will follow-up.  Currently she is stable. Education regarding management of these chronic disease states was given. Management strategies discussed on successive visits include dietary and exercise recommendations, goals of achieving and maintaining IBW, and lifestyle modifications aiming for adequate sleep and minimizing stressors.   Follow up: Return in about 4 weeks (around 08/22/2019) for follow up Hypertension with [name].  No orders of the defined types were placed in this encounter.  Meds ordered this encounter  Medications   promethazine (PHENERGAN) 25 MG tablet    Sig: Take 1 tablet (25 mg total) by mouth every 6 (six) hours as needed for nausea or vomiting.    Dispense:  30 tablet    Refill:  0   lisinopril (ZESTRIL) 20 MG tablet    Sig: Take 1 tablet (20 mg total) by mouth daily.    Dispense:  90 tablet    Refill:  1      BP Readings from Last 3 Encounters:  07/25/19 (!) 158/110  07/03/19 (!) 150/110  04/08/19 (!) 176/98   Wt Readings from Last 3 Encounters:  07/25/19 162 lb 3.2 oz (73.6 kg)  07/03/19 156  lb (70.8 kg)  05/17/19 158 lb (71.7 kg)    No results found for: CHOL No results found for: HDL No results found for: LDLCALC No results found for: TRIG No results found for: CHOLHDL No results found for: LDLDIRECT Lab Results  Component Value Date   CREATININE 0.95 09/07/2018   BUN 8 09/07/2018   NA 144 09/07/2018   K 3.4 (L) 09/07/2018   CL 105 09/07/2018   CO2 24 09/07/2018    The ASCVD Risk score (Goff DC Jr., et al., 2013) failed to calculate for the following reasons:   Cannot find a previous HDL lab   Cannot find a previous total cholesterol lab  I  reviewed the patients updated PMH, FH, and SocHx.    Patient Active Problem List   Diagnosis Date Noted   Acute recurrent sinusitis 08/01/2017   Migraine 11/22/2016    Allergies: Codeine  Social History: Patient  reports that she has never smoked. She has never used smokeless tobacco. She reports current alcohol use. She reports that she does not use drugs.  Current Meds  Medication Sig   albuterol (PROVENTIL HFA;VENTOLIN HFA) 108 (90 Base) MCG/ACT inhaler Inhale 1-2 puffs into the lungs every 4 (four) hours as needed for wheezing or shortness of breath.   budesonide-formoterol (SYMBICORT) 80-4.5 MCG/ACT inhaler TAKE 2 PUFFS BY MOUTH TWICE A DAY   cyclobenzaprine (FLEXERIL) 10 MG tablet Take 10 mg by mouth 3 (three) times daily as needed for muscle spasms.   hydrOXYzine (ATARAX/VISTARIL) 25 MG tablet TAKE 1 TABLET BY MOUTH 30-60 MINUTES PRIOR TO BEDTIME FOR INSOMNIA (ANXIETY). MAY INCREASE TO 2TABS.   lisinopril (ZESTRIL) 20 MG tablet Take 1 tablet (20 mg total) by mouth daily.   medroxyPROGESTERone (DEPO-PROVERA) 150 MG/ML injection Inject 150 mg into the muscle. Every 8-10 weeks.    promethazine (PHENERGAN) 25 MG tablet Take 1 tablet (25 mg total) by mouth every 6 (six) hours as needed for nausea or vomiting.   rizatriptan (MAXALT) 10 MG tablet Take 10 mg by mouth as needed for migraine. May repeat in 2 hours if needed   [DISCONTINUED] lisinopril (ZESTRIL) 20 MG tablet Take 1 tablet (20 mg total) by mouth daily.   [DISCONTINUED] promethazine (PHENERGAN) 25 MG tablet Take 1 tablet (25 mg total) by mouth every 6 (six) hours as needed for nausea or vomiting.    Review of Systems: Cardiovascular: negative for chest pain, palpitations, leg swelling, orthopnea Respiratory: negative for SOB, wheezing or persistent cough Gastrointestinal: negative for abdominal pain Genitourinary: negative for dysuria or gross hematuria  Objective  Vitals: BP (!) 158/110    Pulse (!) 110     Temp 98.1 F (36.7 C) (Tympanic)    Resp 16    Ht 5' 8"  (1.727 m)    Wt 162 lb 3.2 oz (73.6 kg)    SpO2 98%    BMI 24.66 kg/m  General: no acute distress, appears well, Psych:  Alert and oriented, anxious mood and affect HEENT:  Normocephalic, atraumatic, supple neck  Cardiovascular:  RRR without murmur. no edema Respiratory:  Good breath sounds bilaterally, CTAB with normal respiratory effort Skin:  Warm, no rashes Neurologic:   Mental status is normal  Lab Results  Component Value Date   CREATININE 0.95 09/07/2018   BUN 8 09/07/2018   NA 144 09/07/2018   K 3.4 (L) 09/07/2018   CL 105 09/07/2018   CO2 24 09/07/2018   Lab Results  Component Value Date   TSH 1.26 09/07/2018  Commons side effects, risks, benefits, and alternatives for medications and treatment plan prescribed today were discussed, and the patient expressed understanding of the given instructions. Patient is instructed to call or message via MyChart if he/she has any questions or concerns regarding our treatment plan. No barriers to understanding were identified. We discussed Red Flag symptoms and signs in detail. Patient expressed understanding regarding what to do in case of urgent or emergency type symptoms.   Medication list was reconciled, printed and provided to the patient in AVS. Patient instructions and summary information was reviewed with the patient as documented in the AVS. This note was prepared with assistance of Dragon voice recognition software. Occasional wrong-word or sound-a-like substitutions may have occurred due to the inherent limitations of voice recognition software

## 2019-07-25 NOTE — Patient Instructions (Signed)
Please return in 3-4 weeks with Catherine Cox to recheck your blood pressure and stress.   Start checking blood pressures again with a new cuff.  If you have any questions or concerns, please don't hesitate to send me a message via MyChart or call the office at [phone]. Thank you for visiting with [place] today! It's our pleasure caring for you.   Stress Stress is a normal reaction to life events. Stress is what you feel when life demands more than you are used to, or more than you think you can handle. Some stress can be useful, such as studying for a test or meeting a deadline at work. Stress that occurs too often or for too long can cause problems. It can affect your emotional health and interfere with relationships and normal daily activities. Too much stress can weaken your body's defense system (immune system) and increase your risk for physical illness. If you already have a medical problem, stress can make it worse. What are the causes? All sorts of life events can cause stress. An event that causes stress for one person may not be stressful for another person. Major life events, whether positive or negative, commonly cause stress. Examples include:  Losing a job or starting a new job.  Losing a loved one.  Moving to a new town or home.  Getting married or divorced.  Having a baby.  Injury or illness. Less obvious life events can also cause stress, especially if they occur day after day or in combination with each other. Examples include:  Working long hours.  Driving in traffic.  Caring for children.  Being in debt.  Being in a difficult relationship. What are the signs or symptoms? Stress can cause emotional symptoms, including:  Anxiety. This is feeling worried, afraid, on edge, overwhelmed, or out of control.  Anger, including irritation or impatience.  Depression. This is feeling sad, down, helpless, or guilty.  Trouble focusing, remembering, or making decisions.  Stress can cause physical symptoms, including:  Aches and pains. These may affect your head, neck, back, stomach, or other areas of your body.  Tight muscles or a clenched jaw.  Low energy.  Trouble sleeping. Stress can cause unhealthy behaviors, including:  Eating to feel better (overeating) or skipping meals.  Working too much or putting off tasks.  Smoking, drinking alcohol, or using drugs to feel better. How is this diagnosed? Stress is diagnosed through an assessment by your health care provider. He or she may diagnose this condition based on:  Your symptoms and any stressful life events.  Your medical history.  Tests to rule out other causes of your symptoms. Depending on your condition, your health care provider may refer you to a specialist for further evaluation. How is this treated?  Stress management techniques are the recommended treatment for stress. Medicine is not typically recommended for the treatment of stress. Techniques to reduce your reaction to stressful life events include:  Stress identification. Monitor yourself for symptoms of stress and identify what causes stress for you. These skills may help you to avoid or prepare for stressful events.  Time management. Set your priorities, keep a calendar of events, and learn to say "no." Taking these actions can help you avoid making too many commitments. Techniques for coping with stress include:  Rethinking the problem. Try to think realistically about stressful events rather than ignoring them or overreacting. Try to find the positives in a stressful situation rather than focusing on the negatives.  Exercise. Physical  exercise can release both physical and emotional tension. The key is to find a form of exercise that you enjoy and do it regularly.  Relaxation techniques. These relax the body and mind. The key is to find one or more that you enjoy and use the technique(s) regularly. Examples include: ?  Meditation, deep breathing, or progressive relaxation techniques. ? Yoga or tai chi. ? Biofeedback, mindfulness techniques, or journaling. ? Listening to music, being out in nature, or participating in other hobbies.  Practicing a healthy lifestyle. Eat a balanced diet, drink plenty of water, limit or avoid caffeine, and get plenty of sleep.  Having a strong support network. Spend time with family, friends, or other people you enjoy being around. Express your feelings and talk things over with someone you trust. Counseling or talk therapy with a mental health professional may be helpful if you are having trouble managing stress on your own. Follow these instructions at home: Lifestyle   Avoid drugs.  Do not use any products that contain nicotine or tobacco, such as cigarettes and e-cigarettes. If you need help quitting, ask your health care provider.  Limit alcohol intake to no more than 1 drink a day for nonpregnant women and 2 drinks a day for men. One drink equals 12 oz of beer, 5 oz of wine, or 1 oz of hard liquor.  Do not use alcohol or drugs to relax.  Eat a balanced diet that includes fresh fruits and vegetables, whole grains, lean meats, fish, eggs, and beans, and low-fat dairy. Avoid processed foods and foods high in added fat, sugar, and salt.  Exercise at least 30 minutes on 5 or more days each week.  Get 7-8 hours of sleep each night. General instructions   Practice stress management techniques as discussed with your health care provider.  Drink enough fluid to keep your urine clear or pale yellow.  Take over-the-counter and prescription medicines only as told by your health care provider.  Keep all follow-up visits as told by your health care provider. This is important. Contact a health care provider if:  Your symptoms get worse.  You have new symptoms.  You feel overwhelmed by your problems and can no longer manage them on your own. Get help right away if:   You have thoughts of hurting yourself or others. If you ever feel like you may hurt yourself or others, or have thoughts about taking your own life, get help right away. You can go to your nearest emergency department or call:  Your local emergency services (911 in the [place]  A suicide crisis helpline, such as the [place] [facility] at 720-403-2070. This is open 24 hours a day. Summary  Stress is a normal reaction to life events. It can cause problems if it happens too often or for too long.  Practicing stress management techniques is the best way to treat stress.  Counseling or talk therapy with a mental health professional may be helpful if you are having trouble managing stress on your own. This information is not intended to replace advice given to you by your health care provider. Make sure you discuss any questions you have with your health care provider. Document Released: 05/17/2001 Document Revised: 11/03/2017 Document Reviewed: 01/11/2017 Elsevier Patient Education  2020 [employer].

## 2019-07-29 DIAGNOSIS — Z3042 Encounter for surveillance of injectable contraceptive: Secondary | ICD-10-CM | POA: Diagnosis not present

## 2019-08-27 DIAGNOSIS — G43719 Chronic migraine without aura, intractable, without status migrainosus: Secondary | ICD-10-CM | POA: Diagnosis not present

## 2019-08-27 DIAGNOSIS — G43019 Migraine without aura, intractable, without status migrainosus: Secondary | ICD-10-CM | POA: Diagnosis not present

## 2019-10-11 DIAGNOSIS — Z3042 Encounter for surveillance of injectable contraceptive: Secondary | ICD-10-CM | POA: Diagnosis not present

## 2019-12-03 ENCOUNTER — Ambulatory Visit (INDEPENDENT_AMBULATORY_CARE_PROVIDER_SITE_OTHER): Payer: Federal, State, Local not specified - PPO | Admitting: Physician Assistant

## 2019-12-03 ENCOUNTER — Ambulatory Visit: Payer: Federal, State, Local not specified - PPO | Attending: Internal Medicine

## 2019-12-03 ENCOUNTER — Encounter: Payer: Self-pay | Admitting: Physician Assistant

## 2019-12-03 ENCOUNTER — Other Ambulatory Visit: Payer: Self-pay | Admitting: Physician Assistant

## 2019-12-03 DIAGNOSIS — R0981 Nasal congestion: Secondary | ICD-10-CM | POA: Diagnosis not present

## 2019-12-03 DIAGNOSIS — Z20828 Contact with and (suspected) exposure to other viral communicable diseases: Secondary | ICD-10-CM | POA: Diagnosis not present

## 2019-12-03 DIAGNOSIS — Z20822 Contact with and (suspected) exposure to covid-19: Secondary | ICD-10-CM

## 2019-12-03 MED ORDER — CEFUROXIME AXETIL 500 MG PO TABS: 500.0000 mg | ORAL_TABLET | Freq: Two times a day (BID) | ORAL | 0 refills | Status: DC

## 2019-12-03 MED ORDER — CEFTIN 250 MG/5ML PO SUSR: 250.0000 mg | Freq: Two times a day (BID) | ORAL | 0 refills | Status: DC

## 2019-12-03 NOTE — Telephone Encounter (Signed)
See note

## 2019-12-03 NOTE — Addendum Note (Signed)
Addended by: Erlene Quan on: 12/03/2019 04:04 PM   Modules accepted: Orders

## 2019-12-03 NOTE — Progress Notes (Signed)
Virtual Visit via Video   I connected with Catherine Cox on 12/03/19 at 10:40 AM EST by a video enabled telemedicine application and verified that I am speaking with the correct person using two identifiers. Location patient: Home Location provider: [facility], Office Persons participating in the virtual visit: Avangeline Stockburger Pauley, [name] PA-C, Anselmo Pickler, LPN   I discussed the limitations of evaluation and management by telemedicine and the availability of in person appointments. The patient expressed understanding and agreed to proceed.   Subjective:   HPI:   Sinus problem Pt c/o sinus pressure in her forehead and headaches x 4 days. Pt is having post nasal drip. Denies fever, chills, changes in taste/smell, diarrhea, cough, changes in vision, weakness on one side of the body, slurred speech. Pt has tried Sudafed.  ROS: See pertinent positives and negatives per HPI.  Patient Active Problem List   Diagnosis Date Noted  . Acute recurrent sinusitis 08/01/2017  . Migraine 11/22/2016    Social History   Tobacco Use  . Smoking status: Never Smoker  . Smokeless tobacco: Never Used  Substance Use Topics  . Alcohol use: Yes    Comment: occ    Current Outpatient Medications:  .  albuterol (PROVENTIL HFA;VENTOLIN HFA) 108 (90 Base) MCG/ACT inhaler, Inhale 1-2 puffs into the lungs every 4 (four) hours as needed for wheezing or shortness of breath., Disp: 8.5 Inhaler, Rfl: 1 .  budesonide-formoterol (SYMBICORT) 80-4.5 MCG/ACT inhaler, TAKE 2 PUFFS BY MOUTH TWICE A DAY, Disp: 30.6 Inhaler, Rfl: 1 .  cyclobenzaprine (FLEXERIL) 10 MG tablet, Take 10 mg by mouth 3 (three) times daily as needed for muscle spasms., Disp: , Rfl:  .  hydrOXYzine (ATARAX/VISTARIL) 25 MG tablet, TAKE 1 TABLET BY MOUTH 30-60 MINUTES PRIOR TO BEDTIME FOR INSOMNIA (ANXIETY). MAY INCREASE TO 2TABS., Disp: 180 tablet, Rfl: 1 .  lisinopril (ZESTRIL) 20 MG tablet, Take 1 tablet (20 mg total) by mouth daily.,  Disp: 90 tablet, Rfl: 1 .  medroxyPROGESTERone (DEPO-PROVERA) 150 MG/ML injection, Inject 150 mg into the muscle. Every 8-10 weeks. , Disp: , Rfl:  .  promethazine (PHENERGAN) 25 MG tablet, Take 1 tablet (25 mg total) by mouth every 6 (six) hours as needed for nausea or vomiting., Disp: 30 tablet, Rfl: 0 .  rizatriptan (MAXALT) 10 MG tablet, Take 10 mg by mouth as needed for migraine. May repeat in 2 hours if needed, Disp: , Rfl:  .  cefUROXime (CEFTIN) 250 MG/5ML suspension, Take 5 mLs (250 mg total) by mouth 2 (two) times daily for 7 days., Disp: 70 mL, Rfl: 0  Allergies  Allergen Reactions  . Codeine Anaphylaxis    Objective:   VITALS: Per patient if applicable, see vitals. GENERAL: Alert, appears well and in no acute distress. HEENT: Atraumatic, conjunctiva clear, no obvious abnormalities on inspection of external nose and ears. NECK: Normal movements of the head and neck. CARDIOPULMONARY: No increased WOB. Speaking in clear sentences. I:E ratio WNL.  MS: Moves all visible extremities without noticeable abnormality. PSYCH: Pleasant and cooperative, well-groomed. Speech normal rate and rhythm. Affect is appropriate. Insight and judgement are appropriate. Attention is focused, linear, and appropriate.  NEURO: CN grossly intact. Oriented as arrived to appointment on time with no prompting. Moves both UE equally.  SKIN: No obvious lesions, wounds, erythema, or cyanosis noted on face or hands.  Assessment and Plan:   [name] was seen today for sinus problem.  Diagnoses and all orders for this visit:  Sinus congestion -  Novel Coronavirus, NAA ([facility])  Other orders -     cefUROXime (CEFTIN) 250 MG/5ML suspension; Take 5 mLs (250 mg total) by mouth 2 (two) times daily for 7 days.   Patient has a respiratory illness without signs of acute distress or respiratory compromise at this time.   This is her typical sinusitis symptom presentation, I have sent in ceftin (patient prefers  liquid) for her.   Out of an abundance of caution, we are going to send patient for COVID-19 testing. As a precaution, they have been advised to remain home until COVID-19 results and then possible further quarantine after that based on results and symptoms. Advised if they experience a "second sickening" or worsening symptoms as the illness progresses, they are to call the office for further instructions or seek emergent evaluation for any severe symptoms.   . Reviewed expectations re: course of current medical issues. . Discussed self-management of symptoms. . Outlined signs and symptoms indicating need for more acute intervention. . Patient verbalized understanding and all questions were answered. [name] Health Maintenance issues including appropriate healthy diet, exercise, and smoking avoidance were discussed with patient. . See orders for this visit as documented in the electronic medical record.  I discussed the assessment and treatment plan with the patient. The patient was provided an opportunity to ask questions and all were answered. The patient agreed with the plan and demonstrated an understanding of the instructions.   The patient was advised to call back or seek an in-person evaluation if the symptoms worsen or if the condition fails to improve as anticipated.   CMA or LPN served as scribe during this visit. History, Physical, and Plan performed by medical provider. The above documentation has been reviewed and is accurate and complete.  [place], [place] 12/03/2019

## 2019-12-03 NOTE — Telephone Encounter (Signed)
Pharmacy called and stated that this has been discontinued and would like a call back from the nurse if possible. Please advise

## 2019-12-04 LAB — NOVEL CORONAVIRUS, NAA: SARS-CoV-2, NAA: NOT DETECTED

## 2019-12-13 ENCOUNTER — Ambulatory Visit (INDEPENDENT_AMBULATORY_CARE_PROVIDER_SITE_OTHER): Payer: Federal, State, Local not specified - PPO | Admitting: Physician Assistant

## 2019-12-13 ENCOUNTER — Encounter: Payer: Self-pay | Admitting: Physician Assistant

## 2019-12-13 VITALS — BP 166/110 | HR 102 | Temp 98.2°F | Ht 68.0 in | Wt 154.0 lb

## 2019-12-13 DIAGNOSIS — I10 Essential (primary) hypertension: Secondary | ICD-10-CM

## 2019-12-13 DIAGNOSIS — Z3042 Encounter for surveillance of injectable contraceptive: Secondary | ICD-10-CM | POA: Diagnosis not present

## 2019-12-13 LAB — COMPREHENSIVE METABOLIC PANEL
ALT: 10 U/L (ref 0–35)
AST: 12 U/L (ref 0–37)
Albumin: 4.1 g/dL (ref 3.5–5.2)
Alkaline Phosphatase: 72 U/L (ref 39–117)
BUN: 12 mg/dL (ref 6–23)
CO2: 27 mEq/L (ref 19–32)
Calcium: 9.5 mg/dL (ref 8.4–10.5)
Chloride: 105 mEq/L (ref 96–112)
Creatinine, Ser: 0.9 mg/dL (ref 0.40–1.20)
GFR: 81.56 mL/min (ref >60.00)
Glucose, Bld: 87 mg/dL (ref 70–99)
Potassium: 3.8 mEq/L (ref 3.5–5.1)
Sodium: 141 mEq/L (ref 135–145)
Total Bilirubin: 0.4 mg/dL (ref 0.2–1.2)
Total Protein: 6.9 g/dL (ref 6.0–8.3)

## 2019-12-13 LAB — CBC WITH DIFFERENTIAL/PLATELET
Basophils Absolute: 0 10*3/uL (ref 0.0–0.1)
Basophils Relative: 0.5 % (ref 0.0–3.0)
Eosinophils Absolute: 0.6 10*3/uL (ref 0.0–0.7)
Eosinophils Relative: 6.6 % — ABNORMAL HIGH (ref 0.0–5.0)
HCT: 42.1 % (ref 36.0–46.0)
Hemoglobin: 14.1 g/dL (ref 12.0–15.0)
Lymphocytes Relative: 37.2 % (ref 12.0–46.0)
Lymphs Abs: 3.3 10*3/uL (ref 0.7–4.0)
MCHC: 33.6 g/dL (ref 30.0–36.0)
MCV: 91.7 fl (ref 78.0–100.0)
Monocytes Absolute: 0.4 10*3/uL (ref 0.1–1.0)
Monocytes Relative: 4.1 % (ref 3.0–12.0)
Neutro Abs: 4.6 10*3/uL (ref 1.4–7.7)
Neutrophils Relative %: 51.6 % (ref 43.0–77.0)
Platelets: 267 10*3/uL (ref 150.0–400.0)
RBC: 4.6 Mil/uL (ref 3.87–5.11)
RDW: 13.2 % (ref 11.5–15.5)
WBC: 8.9 10*3/uL (ref 4.0–10.5)

## 2019-12-13 LAB — TSH: TSH: 1.2 u[IU]/mL (ref 0.35–4.50)

## 2019-12-13 MED ORDER — LISINOPRIL 40 MG PO TABS: 40.0000 mg | ORAL_TABLET | Freq: Every day | ORAL | 1 refills | Status: DC

## 2019-12-13 NOTE — Progress Notes (Signed)
Catherine Cox is a 44 y.o. female here for a follow up of a pre-existing problem.   I acted as a Education administrator for [employer], PA-C Anselmo Pickler, LPN  History of Present Illness:   Chief Complaint  Patient presents with  . Hypertension    HPI   HTN Currently taking Lisinopril 20 mg. Pt has not been checking blood pressure at home. Today at the GYN office Bp was 160/? She has been having headaches is going to schedule with Neurology for follow up. Patient denies chest pain, SOB, blurred vision, dizziness, lower leg swelling. Patient is compliant with medication. Denies excessive caffeine intake, stimulant usage, excessive alcohol intake, or increase in salt consumption.  BP Readings from Last 3 Encounters:  12/13/19 (!) 166/110  07/25/19 (!) 158/110  07/03/19 (!) 150/110     Past Medical History:  Diagnosis Date  . Environmental allergies   . Migraine headache    [facility] -- Dr. Domingo Cocking, has been going since 2004     Social History   Socioeconomic History  . Marital status: Single    Spouse name: Not on file  . Number of children: Not on file  . Years of education: Not on file  . Highest education level: Not on file  Occupational History  . Not on file  Tobacco Use  . Smoking status: Never Smoker  . Smokeless tobacco: Never Used  Substance and Sexual Activity  . Alcohol use: Yes    Comment: occ  . Drug use: No  . Sexual activity: Yes    Birth control/protection: Injection  Other Topics Concern  . Not on file  Social History Narrative   Research scientist (medical) with [employer]   2 chlidren - 10 and 46 y/o   Single   Social Determinants of Health   Financial Resource Strain:   . Difficulty of Paying Living Expenses: Not on file  Food Insecurity:   . Worried About Charity fundraiser in the Last Year: Not on file  . Ran Out of Food in the Last Year: Not on file  Transportation Needs:   . Lack of Transportation (Medical): Not on file  . Lack of  Transportation (Non-Medical): Not on file  Physical Activity:   . Days of Exercise per Week: Not on file  . Minutes of Exercise per Session: Not on file  Stress:   . Feeling of Stress : Not on file  Social Connections:   . Frequency of Communication with Friends and Family: Not on file  . Frequency of Social Gatherings with Friends and Family: Not on file  . Attends Religious Services: Not on file  . Active Member of Clubs or Organizations: Not on file  . Attends Archivist Meetings: Not on file  . Marital Status: Not on file  Intimate Partner Violence:   . Fear of Current or Ex-Partner: Not on file  . Emotionally Abused: Not on file  . Physically Abused: Not on file  . Sexually Abused: Not on file    Past Surgical History:  Procedure Laterality Date  . TONSILLECTOMY      Family History  Problem Relation Age of Onset  . Alzheimer's disease Mother   . Cancer Father        lymphoma and lung  . Breast cancer Neg Hx   . Colon cancer Neg Hx     Allergies  Allergen Reactions  . Codeine Anaphylaxis    Current Medications:   Current Outpatient Medications:  .  albuterol (PROVENTIL HFA;VENTOLIN HFA) 108 (90 Base) MCG/ACT inhaler, Inhale 1-2 puffs into the lungs every 4 (four) hours as needed for wheezing or shortness of breath., Disp: 8.5 Inhaler, Rfl: 1 .  budesonide-formoterol (SYMBICORT) 80-4.5 MCG/ACT inhaler, TAKE 2 PUFFS BY MOUTH TWICE A DAY, Disp: 30.6 Inhaler, Rfl: 1 .  cefUROXime (CEFTIN) 500 MG tablet, Take 1 tablet (500 mg total) by mouth 2 (two) times daily with a meal., Disp: 14 tablet, Rfl: 0 .  cyclobenzaprine (FLEXERIL) 10 MG tablet, Take 10 mg by mouth 3 (three) times daily as needed for muscle spasms., Disp: , Rfl:  .  hydrOXYzine (ATARAX/VISTARIL) 25 MG tablet, TAKE 1 TABLET BY MOUTH 30-60 MINUTES PRIOR TO BEDTIME FOR INSOMNIA (ANXIETY). MAY INCREASE TO 2TABS., Disp: 180 tablet, Rfl: 1 .  medroxyPROGESTERone (DEPO-PROVERA) 150 MG/ML injection, Inject  150 mg into the muscle. Every 8-10 weeks. , Disp: , Rfl:  .  promethazine (PHENERGAN) 25 MG tablet, Take 1 tablet (25 mg total) by mouth every 6 (six) hours as needed for nausea or vomiting., Disp: 30 tablet, Rfl: 0 .  rizatriptan (MAXALT) 10 MG tablet, Take 10 mg by mouth as needed for migraine. May repeat in 2 hours if needed, Disp: , Rfl:  .  lisinopril (ZESTRIL) 40 MG tablet, Take 1 tablet (40 mg total) by mouth daily., Disp: 90 tablet, Rfl: 1   Review of Systems:   ROS  Negative unless otherwise specified per HPI.  Vitals:   Vitals:   12/13/19 1253 12/13/19 1312  BP: (!) 160/120 (!) 166/110  Pulse: (!) 108 (!) 102  Temp: 98.2 F (36.8 C)   TempSrc: Temporal   SpO2: 98%   Weight: 154 lb (69.9 kg)   Height: 5' 8"  (1.727 m)      Body mass index is 23.42 kg/m.  Physical Exam:   Physical Exam Vitals and nursing note reviewed.  Constitutional:      General: She is not in acute distress.    Appearance: She is well-developed. She is not ill-appearing or toxic-appearing.  Cardiovascular:     Rate and Rhythm: Regular rhythm. Tachycardia present.     Pulses: Normal pulses.     Heart sounds: Normal heart sounds, S1 normal and S2 normal.     Comments: No LE edema Pulmonary:     Effort: Pulmonary effort is normal.     Breath sounds: Normal breath sounds.  Skin:    General: Skin is warm and dry.  Neurological:     Mental Status: She is alert.     GCS: GCS eye subscore is 4. GCS verbal subscore is 5. GCS motor subscore is 6.  Psychiatric:        Speech: Speech normal.        Behavior: Behavior normal. Behavior is cooperative.      Assessment and Plan:   Catherine Cox was seen today for hypertension.  Diagnoses and all orders for this visit:  Essential hypertension -     TSH -     CBC with Differential/Platelet -     Comprehensive metabolic panel  Other orders -     lisinopril (ZESTRIL) 40 MG tablet; Take 1 tablet (40 mg total) by mouth daily.   Uncontrolled, and is  currently asymptomatic. Will increase lisinopril to 40 mg daily. No red flags on discussion. Will update labs as well today. Worsening precautions advised, follow-up in 1 months, sooner if concerns.  . Reviewed expectations re: course of current medical issues. . Discussed self-management of symptoms. [name]  Outlined signs and symptoms indicating need for more acute intervention. . Patient verbalized understanding and all questions were answered. . See orders for this visit as documented in the electronic medical record. . Patient received an After-Visit Summary.  CMA or LPN served as scribe during this visit. History, Physical, and Plan performed by medical provider. The above documentation has been reviewed and is accurate and complete.   [name], PA-C

## 2019-12-13 NOTE — Patient Instructions (Signed)
It was great to see you!  Happy Birthday!!!  Increase Lisinopril to 40 mg -- this has been sent in for you.  Let's follow-up in 1 month, sooner if you have concerns.  Take care,  [name] PA-C

## 2019-12-16 ENCOUNTER — Telehealth: Payer: Self-pay | Admitting: Physician Assistant

## 2019-12-16 NOTE — Telephone Encounter (Signed)
Pt called back, just wanted to ask if I would send lab results to [facility] to Dr. Garwin Brothers so she does not have to repeat labs for upcoming appt. Told her I will fax results over first thing tomorrow morning. Pt verbalized understanding.

## 2019-12-16 NOTE — Telephone Encounter (Signed)
Left message on voicemail to call office.

## 2019-12-16 NOTE — Telephone Encounter (Signed)
Patient called in saying that her OBGYN is needing a copy of her lab work:  Dr.Sheronette A. [address], [place], [place] [id number]  Patient also would like to speak to the nurse about some things

## 2019-12-17 NOTE — Telephone Encounter (Signed)
Labs faxed to [facility] as pt requested.

## 2019-12-25 DIAGNOSIS — Z1151 Encounter for screening for human papillomavirus (HPV): Secondary | ICD-10-CM | POA: Diagnosis not present

## 2019-12-25 DIAGNOSIS — Z01419 Encounter for gynecological examination (general) (routine) without abnormal findings: Secondary | ICD-10-CM | POA: Diagnosis not present

## 2019-12-25 DIAGNOSIS — Z6822 Body mass index (BMI) 22.0-22.9, adult: Secondary | ICD-10-CM | POA: Diagnosis not present

## 2020-01-10 ENCOUNTER — Telehealth: Payer: Self-pay

## 2020-01-10 NOTE — Telephone Encounter (Signed)
Left message on voicemail to call office.

## 2020-01-10 NOTE — Telephone Encounter (Signed)
Pt requesting a c/b. She wouldn't provide additional info

## 2020-01-13 MED ORDER — LISINOPRIL-HYDROCHLOROTHIAZIDE 20-12.5 MG PO TABS: 1.0000 | ORAL_TABLET | Freq: Every day | ORAL | 1 refills | Status: DC

## 2020-01-13 NOTE — Telephone Encounter (Signed)
Left message on voicemail to call office.

## 2020-01-13 NOTE — Telephone Encounter (Signed)
Catherine Cox, please see message.

## 2020-01-13 NOTE — Addendum Note (Signed)
Addended by: Marian Sorrow on: 01/13/2020 03:13 PM   Modules accepted: Orders

## 2020-01-13 NOTE — Telephone Encounter (Signed)
Did she have these cramps at the prior dosage, 20 mg?  One option would be to go back to that and add a diuretic. So instead of increasing the dosage of Lisinopril from 20 mg to 75m, we will go back to 20 mg and add hydrochlorothiazide to it.  If she is agreeable, please let me know and I can send this in. Alternatively, we got back to Lisinopril 20 mg daily and keep a close eye on her pressure.   Follow-up with me regardless in office in 2 weeks, sooner if concerns.

## 2020-01-13 NOTE — Telephone Encounter (Signed)
Patient returning call.

## 2020-01-13 NOTE — Telephone Encounter (Signed)
Pt called back, asked her if she had these cramps when she was taking Lisinopril 20 mg? Pt said no. Told pt [name] said we can go back to Lisinopril 20 mg and add a diuretic to it, so instead of taking Lisinopril 40 mg. We can put you on Lisinopril 20 mg -HCTZ 12.5 mg combination tablet so you just have to take one pill. Pt verbalized understanding and asked if it will still bring her blood pressure down. Told pt yes the diuretic will still help with blood pressure. Pt verbalized understanding and said okay. Told pt will send new Rx to th pharmacy. Pt verbalized understanding.

## 2020-01-13 NOTE — Telephone Encounter (Signed)
Pt called back, c/o stomach cramps few hours after taking Lisinopril. Pt says was increased to 40 mg last visit to help with blood pressure. She says blood pressure is coming down but she is getting cramps. Pt said Blood pressure yesterday was 134/103. Asked pt if bottom number has been below 100? She said 98-103. Told her will send message to [facility] and get back to her. Pt verbalized understanding.

## 2020-01-23 ENCOUNTER — Other Ambulatory Visit: Payer: Self-pay | Admitting: Family Medicine

## 2020-02-04 ENCOUNTER — Other Ambulatory Visit: Payer: Self-pay | Admitting: Physician Assistant

## 2020-02-07 ENCOUNTER — Telehealth: Payer: Self-pay | Admitting: Physician Assistant

## 2020-02-07 DIAGNOSIS — Z3042 Encounter for surveillance of injectable contraceptive: Secondary | ICD-10-CM | POA: Diagnosis not present

## 2020-02-07 NOTE — Telephone Encounter (Signed)
Patient called in this afternoon, and says that her BP meds were lowered due to cramping but she says that her stomach is still cramping when she takes the medication. Asked if Butch Penny could give her a call back.

## 2020-02-07 NOTE — Telephone Encounter (Signed)
LVM for patient to call the office to get an appointment scheduled.

## 2020-02-07 NOTE — Telephone Encounter (Signed)
Please see message and advise 

## 2020-02-07 NOTE — Telephone Encounter (Signed)
Needs appointment

## 2020-02-07 NOTE — Telephone Encounter (Signed)
Please call pt and schedule an appointment for Monday per [facility].

## 2020-02-28 ENCOUNTER — Ambulatory Visit: Payer: Federal, State, Local not specified - PPO | Attending: Internal Medicine

## 2020-02-28 ENCOUNTER — Other Ambulatory Visit: Payer: Self-pay | Admitting: Physician Assistant

## 2020-02-28 DIAGNOSIS — Z23 Encounter for immunization: Secondary | ICD-10-CM

## 2020-02-28 NOTE — Progress Notes (Signed)
   Covid-19 Vaccination Clinic  Name:  Catherine Cox    MRN: [id number] DOB: [birth year]  02/28/2020  Catherine Cox was observed post Covid-19 immunization for 15 minutes without incident. She was provided with Vaccine Information Sheet and instruction to access the V-Safe system.   Catherine Cox was instructed to call 911 with any severe reactions post vaccine: [name] Difficulty breathing  . Swelling of face and throat  . A fast heartbeat  . A bad rash all over body  . Dizziness and weakness   Immunizations Administered    Name Date Dose VIS Date Route   Pfizer COVID-19 Vaccine 02/28/2020  9:01 AM 0.3 mL 11/15/2019 Intramuscular   Manufacturer: [place]   Lot: [id number]   [place]: [id number]

## 2020-03-10 ENCOUNTER — Telehealth: Payer: Self-pay

## 2020-03-10 NOTE — Telephone Encounter (Signed)
Patient believes that her blood pressure medicine is causing her bp to decrease. Patient states that her BP was 98/74 Sunday and she was light headed. Please return patient call as soon as possible.

## 2020-03-10 NOTE — Telephone Encounter (Signed)
Spoke to pt, said she had some lightheadedness over the weekend on Sunday and Bp was 98/74. Pt asked if that is too low. Told pt no as long as you are not having dizziness all the time. Asked pt what her blood pressure was today? Pt said 103/70's and no further lightheadedness. Told pt to continue monitor blood pressure if consistently 90/60 or having dizziness or lightheadedness to schedule an appt. Pt verbalized understanding.

## 2020-03-25 ENCOUNTER — Ambulatory Visit: Payer: Federal, State, Local not specified - PPO | Attending: Internal Medicine

## 2020-03-25 DIAGNOSIS — Z23 Encounter for immunization: Secondary | ICD-10-CM

## 2020-03-25 NOTE — Progress Notes (Signed)
   Covid-19 Vaccination Clinic  Name:  Catherine Cox    MRN: [id number] DOB: Jan 13, 1976  03/25/2020  [name] was observed post Covid-19 immunization for 15 minutes without incident. She was provided with Vaccine Information Sheet and instruction to access the V-Safe system.   [name] was instructed to call 911 with any severe reactions post vaccine: [name] Difficulty breathing  . Swelling of face and throat  . A fast heartbeat  . A bad rash all over body  . Dizziness and weakness   Immunizations Administered    Name Date Dose VIS Date Route   Pfizer COVID-19 Vaccine 03/25/2020  4:02 PM 0.3 mL 01/29/2019 Intramuscular   Manufacturer: [place]   Lot: [id number]   [place]: [id number]

## 2020-04-03 DIAGNOSIS — Z3042 Encounter for surveillance of injectable contraceptive: Secondary | ICD-10-CM | POA: Diagnosis not present

## 2020-04-05 ENCOUNTER — Other Ambulatory Visit: Payer: Self-pay | Admitting: Physician Assistant

## 2020-05-05 ENCOUNTER — Other Ambulatory Visit: Payer: Self-pay | Admitting: Family Medicine

## 2020-05-29 DIAGNOSIS — Z3042 Encounter for surveillance of injectable contraceptive: Secondary | ICD-10-CM | POA: Diagnosis not present

## 2020-05-29 DIAGNOSIS — Z1231 Encounter for screening mammogram for malignant neoplasm of breast: Secondary | ICD-10-CM | POA: Diagnosis not present

## 2020-05-29 LAB — HM MAMMOGRAPHY

## 2020-06-01 ENCOUNTER — Encounter (HOSPITAL_COMMUNITY): Payer: Self-pay

## 2020-06-01 ENCOUNTER — Other Ambulatory Visit: Payer: Self-pay

## 2020-06-01 ENCOUNTER — Telehealth: Payer: Self-pay | Admitting: Physician Assistant

## 2020-06-01 ENCOUNTER — Emergency Department (HOSPITAL_COMMUNITY)
Admission: EM | Admit: 2020-06-01 | Discharge: 2020-06-01 | Disposition: A | Discharge status: Home / Self Care | Payer: Federal, State, Local not specified - PPO | Attending: Emergency Medicine | Admitting: Emergency Medicine

## 2020-06-01 DIAGNOSIS — K920 Hematemesis: Secondary | ICD-10-CM | POA: Diagnosis not present

## 2020-06-01 DIAGNOSIS — D649 Anemia, unspecified: Secondary | ICD-10-CM | POA: Diagnosis not present

## 2020-06-01 DIAGNOSIS — I959 Hypotension, unspecified: Secondary | ICD-10-CM | POA: Insufficient documentation

## 2020-06-01 DIAGNOSIS — R1111 Vomiting without nausea: Secondary | ICD-10-CM | POA: Diagnosis not present

## 2020-06-01 DIAGNOSIS — R000 Tachycardia, unspecified: Secondary | ICD-10-CM | POA: Insufficient documentation

## 2020-06-01 DIAGNOSIS — K922 Gastrointestinal hemorrhage, unspecified: Secondary | ICD-10-CM | POA: Diagnosis not present

## 2020-06-01 LAB — COMPREHENSIVE METABOLIC PANEL
ALT: 11 U/L (ref 0–44)
AST: 12 U/L — ABNORMAL LOW (ref 15–41)
Albumin: 3 g/dL — ABNORMAL LOW (ref 3.5–5.0)
Alkaline Phosphatase: 36 U/L — ABNORMAL LOW (ref 38–126)
Anion gap: 9 (ref 5–15)
BUN: 48 mg/dL — ABNORMAL HIGH (ref 6–20)
CO2: 21 mmol/L — ABNORMAL LOW (ref 22–32)
Calcium: 8.2 mg/dL — ABNORMAL LOW (ref 8.9–10.3)
Chloride: 110 mmol/L (ref 98–111)
Creatinine, Ser: 0.87 mg/dL (ref 0.44–1.00)
GFR calc Af Amer: >60 mL/min (ref >60)
GFR calc non Af Amer: >60 mL/min (ref >60)
Glucose, Bld: 113 mg/dL — ABNORMAL HIGH (ref 70–99)
Potassium: 4 mmol/L (ref 3.5–5.1)
Sodium: 140 mmol/L (ref 135–145)
Total Bilirubin: 0.5 mg/dL (ref 0.3–1.2)
Total Protein: 5.8 g/dL — ABNORMAL LOW (ref 6.5–8.1)

## 2020-06-01 LAB — PROTIME-INR
INR: 1.1 (ref 0.8–1.2)
Prothrombin Time: 13.9 seconds (ref 11.4–15.2)

## 2020-06-01 LAB — CBC WITH DIFFERENTIAL/PLATELET
Abs Immature Granulocytes: 0.08 10*3/uL — ABNORMAL HIGH (ref 0.00–0.07)
Basophils Absolute: 0 10*3/uL (ref 0.0–0.1)
Basophils Relative: 0 %
Eosinophils Absolute: 0.1 10*3/uL (ref 0.0–0.5)
Eosinophils Relative: 0 %
HCT: 28.1 % — ABNORMAL LOW (ref 36.0–46.0)
Hemoglobin: 9.4 g/dL — ABNORMAL LOW (ref 12.0–15.0)
Immature Granulocytes: 1 %
Lymphocytes Relative: 20 %
Lymphs Abs: 2.7 10*3/uL (ref 0.7–4.0)
MCH: 31.3 pg (ref 26.0–34.0)
MCHC: 33.5 g/dL (ref 30.0–36.0)
MCV: 93.7 fL (ref 80.0–100.0)
Monocytes Absolute: 0.5 10*3/uL (ref 0.1–1.0)
Monocytes Relative: 4 %
Neutro Abs: 10.5 10*3/uL — ABNORMAL HIGH (ref 1.7–7.7)
Neutrophils Relative %: 75 %
Platelets: 251 10*3/uL (ref 150–400)
RBC: 3 MIL/uL — ABNORMAL LOW (ref 3.87–5.11)
RDW: 12.8 % (ref 11.5–15.5)
WBC: 13.9 10*3/uL — ABNORMAL HIGH (ref 4.0–10.5)
nRBC: 0 % (ref 0.0–0.2)

## 2020-06-01 LAB — URINALYSIS, ROUTINE W REFLEX MICROSCOPIC
Bilirubin Urine: NEGATIVE
Glucose, UA: NEGATIVE mg/dL
Hgb urine dipstick: NEGATIVE
Ketones, ur: 20 mg/dL — AB
Leukocytes,Ua: NEGATIVE
Nitrite: NEGATIVE
Protein, ur: NEGATIVE mg/dL
Specific Gravity, Urine: 1.019 (ref 1.005–1.030)
pH: 7 (ref 5.0–8.0)

## 2020-06-01 LAB — POC OCCULT BLOOD, ED: Fecal Occult Bld: NEGATIVE

## 2020-06-01 LAB — HCG, QUANTITATIVE, PREGNANCY: hCG, Beta Chain, Quant, S: <1 m[IU]/mL (ref <5)

## 2020-06-01 LAB — TYPE AND SCREEN
ABO/RH(D): A POS
Antibody Screen: NEGATIVE

## 2020-06-01 LAB — ABO/RH: ABO/RH(D): A POS

## 2020-06-01 MED ORDER — SODIUM CHLORIDE 0.9 % IV BOLUS
1000.0000 mL | Freq: Once | INTRAVENOUS | Status: AC
  Administered 2020-06-01: 1000 mL via INTRAVENOUS

## 2020-06-01 MED ORDER — FERROUS SULFATE 325 (65 FE) MG PO TABS
325.0000 mg | ORAL_TABLET | Freq: Once | ORAL | Status: AC
  Administered 2020-06-01: 325 mg via ORAL
  Filled 2020-06-01: qty 1

## 2020-06-01 MED ORDER — FERROUS SULFATE 325 (65 FE) MG PO TABS
325.0000 mg | ORAL_TABLET | Freq: Every day | ORAL | 0 refills | Status: DC
Start: 2020-06-01 — End: 2021-06-15

## 2020-06-01 MED ORDER — PANTOPRAZOLE SODIUM 20 MG PO TBEC
20.0000 mg | DELAYED_RELEASE_TABLET | Freq: Every day | ORAL | 1 refills | Status: DC
Start: 2020-06-01 — End: 2020-07-27

## 2020-06-01 MED ORDER — PANTOPRAZOLE SODIUM 40 MG IV SOLR
40.0000 mg | Freq: Once | INTRAVENOUS | Status: AC
  Administered 2020-06-01: 40 mg via INTRAVENOUS
  Filled 2020-06-01: qty 40

## 2020-06-01 NOTE — Telephone Encounter (Signed)
Patient called stating that her BP had dropped low and that she is dehydrated.  States she was experiencing dizziness.  Patient is currently at the ED.  She was advised to stop the BP med.  Patient has follow up scheduled for tomorrow 6/29.

## 2020-06-01 NOTE — ED Notes (Signed)
Patient arrived via [facility] c/o hypotension and vomiting blood.  Recent BP med change.  Denies pain or rectal bleeding.  BP with EMS was 88/76, 300 ml NS given, BP then 104/72.  HR was 120/min, after fluids came down to 104.  CBG 110. 20g left AC.

## 2020-06-01 NOTE — ED Provider Notes (Signed)
[place] DEPT Provider Note   CSN: [id number] Arrival date & time: 06/01/20  1505     History Chief Complaint  Patient presents with  . Hypotension  . Hematemesis    Catherine Cox is a 44 y.o. female.  She has no significant past medical history.  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  Did not eat or drink much.  Woke up around 3:30 AM with nausea and vomited, saw a little blood in it.  Recurred again around 630 this morning.  Called EMS and found to be hypotensive.  No abdominal pain.  No blood thinners.  Currently denies any complaints.  No headache chest pain shortness of breath cough abdominal pain diarrhea or urinary symptoms.  No lightheadedness.  The history is provided by the patient.  Emesis Severity:  Moderate Timing:  Sporadic Number of daily episodes:  2 Quality:  Bright red blood and undigested food Progression:  Resolved Chronicity:  New Recent urination:  Normal Relieved by:  None tried Worsened by:  Nothing Ineffective treatments:  None tried Associated symptoms: no abdominal pain, no chills, no cough, no diarrhea, no fever, no headaches, no myalgias and no sore throat   Risk factors: no diabetes and no sick contacts        Past Medical History:  Diagnosis Date  . Environmental allergies   . Migraine headache    [facility] -- Dr. Domingo Cocking, has been going since 2004    Patient Active Problem List   Diagnosis Date Noted  . Acute recurrent sinusitis 08/01/2017  . Migraine 11/22/2016    Past Surgical History:  Procedure Laterality Date  . TONSILLECTOMY       OB History   No obstetric history on file.     Family History  Problem Relation Age of Onset  . Alzheimer's disease Mother   . Cancer Father        lymphoma and lung  . Breast cancer Neg Hx   . Colon cancer Neg Hx     Social History   Tobacco Use  . Smoking status: Never Smoker  . Smokeless tobacco: Never Used    Substance Use Topics  . Alcohol use: Yes    Comment: occ  . Drug use: No    Home Medications Prior to Admission medications   Medication Sig Start Date End Date Taking? Authorizing Provider  albuterol (PROVENTIL HFA;VENTOLIN HFA) 108 (90 Base) MCG/ACT inhaler Inhale 1-2 puffs into the lungs every 4 (four) hours as needed for wheezing or shortness of breath. 12/28/18   [name], PA  budesonide-formoterol (SYMBICORT) 80-4.5 MCG/ACT inhaler TAKE 2 PUFFS BY MOUTH TWICE A DAY 05/22/19   [name], PA  cefUROXime (CEFTIN) 500 MG tablet Take 1 tablet (500 mg total) by mouth 2 (two) times daily with a meal. 12/03/19   [name], PA  cyclobenzaprine (FLEXERIL) 10 MG tablet Take 10 mg by mouth 3 (three) times daily as needed for muscle spasms.    [provider]  hydrOXYzine (ATARAX/VISTARIL) 25 MG tablet TAKE 1 TABLET BY MOUTH 30-60 MINUTES PRIOR TO BEDTIME FOR INSOMNIA (ANXIETY). MAY INCREASE TO 2TABS. 07/03/19   [name], PA  lisinopril (ZESTRIL) 40 MG tablet Take 1 tablet (40 mg total) by mouth daily. 12/13/19   [name], PA  lisinopril-hydrochlorothiazide (ZESTORETIC) 20-12.5 MG tablet TAKE 1 TABLET BY MOUTH EVERY DAY 05/05/20   Vivi Barrack, MD  medroxyPROGESTERone (DEPO-PROVERA) 150 MG/ML injection Inject 150 mg  into the muscle. Every 8-10 weeks.     [provider]  promethazine (PHENERGAN) 25 MG tablet Take 1 tablet (25 mg total) by mouth every 6 (six) hours as needed for nausea or vomiting. 07/25/19   Leamon Arnt, MD  rizatriptan (MAXALT) 10 MG tablet Take 10 mg by mouth as needed for migraine. May repeat in 2 hours if needed    [provider]    Allergies    Codeine  Review of Systems   Review of Systems  Constitutional: Negative for chills and fever.  HENT: Negative for sore throat.   Eyes: Negative for visual disturbance.  Respiratory: Negative for cough and shortness of breath.   Cardiovascular: Negative for chest  pain.  Gastrointestinal: Positive for vomiting. Negative for abdominal pain and diarrhea.  Genitourinary: Negative for dysuria.  Musculoskeletal: Negative for myalgias.  Skin: Negative for rash.  Neurological: Negative for headaches.    Physical Exam Updated Vital Signs BP 116/84   Pulse (!) 110   Temp 98.2 F (36.8 C)   Resp 20   Ht 5' 8"  (1.727 m)   Wt 64.9 kg   SpO2 100%   BMI 21.74 kg/m   Physical Exam Vitals and nursing note reviewed.  Constitutional:      General: She is not in acute distress.    Appearance: Normal appearance. She is well-developed.  HENT:     Head: Normocephalic and atraumatic.  Eyes:     Conjunctiva/sclera: Conjunctivae normal.  Cardiovascular:     Rate and Rhythm: Regular rhythm. Tachycardia present.     Heart sounds: No murmur heard.   Pulmonary:     Effort: Pulmonary effort is normal. No respiratory distress.     Breath sounds: Normal breath sounds.  Abdominal:     Palpations: Abdomen is soft.     Tenderness: There is no abdominal tenderness. There is no guarding or rebound.  Musculoskeletal:        General: No deformity or signs of injury. Normal range of motion.     Cervical back: Neck supple.  Skin:    General: Skin is warm and dry.     Capillary Refill: Capillary refill takes less than 2 seconds.  Neurological:     General: No focal deficit present.     Mental Status: She is alert.     ED Results / Procedures / Treatments   Labs (all labs ordered are listed, but only abnormal results are displayed) Labs Reviewed  COMPREHENSIVE METABOLIC PANEL - Abnormal; Notable for the following components:      Result Value   CO2 21 (*)    Glucose, Bld 113 (*)    BUN 48 (*)    Calcium 8.2 (*)    Total Protein 5.8 (*)    Albumin 3.0 (*)    AST 12 (*)    Alkaline Phosphatase 36 (*)    All other components within normal limits  CBC WITH DIFFERENTIAL/PLATELET - Abnormal; Notable for the following components:   WBC 13.9 (*)    RBC 3.00  (*)    Hemoglobin 9.4 (*)    HCT 28.1 (*)    Neutro Abs 10.5 (*)    Abs Immature Granulocytes 0.08 (*)    All other components within normal limits  URINALYSIS, ROUTINE W REFLEX MICROSCOPIC - Abnormal; Notable for the following components:   Color, Urine STRAW (*)    Ketones, ur 20 (*)    All other components within normal limits  PROTIME-INR  HCG,  QUANTITATIVE, PREGNANCY  POC OCCULT BLOOD, ED  TYPE AND SCREEN  ABO/RH    EKG None  Radiology No results found.  Procedures .Critical Care Performed by: Hayden Rasmussen, MD Authorized by: Hayden Rasmussen, MD   Critical care provider statement:    Critical care time (minutes):  45   Critical care time was exclusive of:  Separately billable procedures and treating other patients   Critical care was necessary to treat or prevent imminent or life-threatening deterioration of the following conditions:  Dehydration and circulatory failure   Critical care was time spent personally by me on the following activities:  Discussions with consultants, evaluation of patient's response to treatment, examination of patient, ordering and performing treatments and interventions, ordering and review of laboratory studies, ordering and review of radiographic studies, pulse oximetry, re-evaluation of patient's condition, obtaining history from patient or surrogate, review of old charts and development of treatment plan with patient or surrogate   I assumed direction of critical care for this patient from another provider in my specialty: no     (including critical care time)  Medications Ordered in ED Medications  sodium chloride 0.9 % bolus 1,000 mL (0 mLs Intravenous Stopped 06/01/20 0951)  pantoprazole (PROTONIX) injection 40 mg (40 mg Intravenous Given 06/01/20 0740)  sodium chloride 0.9 % bolus 1,000 mL (0 mLs Intravenous Stopped 06/01/20 1258)  ferrous sulfate tablet 325 mg (325 mg Oral Given 06/01/20 1052)    ED Course  I have reviewed the  triage vital signs and the nursing notes.  Pertinent labs & imaging results that were available during my care of the patient were reviewed by me and considered in my medical decision making (see chart for details).  Clinical Course as of Jun 01 1704  Mon Jun 01, 2020  7322 Rectal exam done with tech as chaperone.  Hard stool in vault no gross bleeding no masses.   [MB]  0254 Patient's orthostatics were positive after a liter of fluid.  I reevaluated the patient.  I explained my concerns with her tachycardia low blood pressure and new anemia that she should stay in the hospital.  She understands but she is unwilling to stay.  She said she has been anemic before and usually improves with iron.  She is going to stop her blood pressure medicine and follow-up with her primary care doctor.  She will be signing out Daisy.   [MB]    Clinical Course User Index [MB] Hayden Rasmussen, MD   MDM Rules/Calculators/A&P                         This patient complains of vomiting, hematemesis; this involves an extensive number of treatment Options and is a complaint that carries with it a high risk of complications and Morbidity. The differential includes gastritis, Mallory-Weiss, peptic ulcer disease, variceal bleed, dehydration, anemia  I ordered, reviewed and interpreted labs, which included CBC with elevated white count and low hemoglobin chemistries with elevated BUN possibly reflecting some GI bleeding urinalysis with ketones, pregnancy test negative I ordered medication IV fluids and PPI  Previous records obtained and reviewed in epic most recent hemoglobins have been closer to 14  Critical Interventions: Management of hypotension with IV fluids and PPI, possible GI bleed  After the interventions stated above, I reevaluated the patient and found still to have soft blood pressures and tachycardia.  She will need to be admitted to the hospital for further  work-up.  She is refusing  admission states she feels better and can follow-up with her primary care doctor.  Is holding her blood pressure medicine.  We will put her on iron and PPI.  Return instructions discussed.   Final Clinical Impression(s) / ED Diagnoses Final diagnoses:  Hypotension, unspecified hypotension type  Acute anemia  Upper GI bleed    Rx / DC Orders ED Discharge Orders         Ordered    ferrous sulfate 325 (65 FE) MG tablet  Daily     Discontinue  Reprint     06/01/20 1226    pantoprazole (PROTONIX) 20 MG tablet  Daily     Discontinue  Reprint     06/01/20 1226           Hayden Rasmussen, MD 06/01/20 1709

## 2020-06-01 NOTE — Discharge Instructions (Addendum)
You were seen in the emergency department for evaluation of nausea and vomiting of some blood.  Your blood count showed you to be anemic and your blood pressure was low here.  We recommended that you be admitted to the hospital.  You declined to be admitted.  Please follow-up with your primary care doctor.  Hold your blood pressure medicine until you follow-up with them.  We are starting you on some acid medication along with the iron supplement.  Return to the emergency department for any worsening or concerning symptoms

## 2020-06-01 NOTE — Telephone Encounter (Signed)
FYI, see message. 

## 2020-06-02 ENCOUNTER — Telehealth: Payer: Self-pay | Admitting: *Deleted

## 2020-06-02 ENCOUNTER — Ambulatory Visit: Payer: Federal, State, Local not specified - PPO | Admitting: Physician Assistant

## 2020-06-02 NOTE — Telephone Encounter (Signed)
I agree with the plan -- per the ED provider, patient was told to be admitted for further work-up. I cannot provide any services for patient at this time and urge her to return to hospital ASAP.

## 2020-06-02 NOTE — Telephone Encounter (Signed)
Left message on voicemail to call office.

## 2020-06-02 NOTE — Telephone Encounter (Signed)
I have spoken with patient further explaining that she needed to have further treatment and testing done at the hospital.  Patient stated she understood.  I am going to cancel the appointment for today.

## 2020-06-02 NOTE — Telephone Encounter (Signed)
See messages, appt was cancelled.

## 2020-06-02 NOTE — Telephone Encounter (Signed)
Spoke to pt told her Dr. Jerline Pain and [name] both reviewed your chart and suggest you go back to the ED to be treated due to GI bleed and possibly upper GI bleed. Pt said she was treated given iron and she is not vomiting, and her stomach does not hurt. Told pt they wanted to admit you yesterday and you left. Pt said I was treated and what am I suppose to do about my blood pressure medication I was told to stop and follow up with my PCP. Told her [name] is not going to change your blood pressure medication due to being dehydrated. Pt said she was given fluids in the ED she is not dehydrated anymore. Told pt there is nothing we can do for you here in the office and need to go to the ED. Pt said need to talk to [facility] about my blood pressure. Told her I will talk to Sam and get back to her.

## 2020-06-02 NOTE — Telephone Encounter (Signed)
Spoke with patient as well and she is confused as to why she needs to go back to the ED. I explained the reason why she needs to return and she states that she is not having a GI Bleed or dehydrated. She was supposed to be admitted yesterday at the ER. Notified patient that there is not much we can do for her here in the office. Pt is very frustrated and states that she is going to find another PCP. She is requesting to speak to [facility] directly.

## 2020-06-03 ENCOUNTER — Other Ambulatory Visit: Payer: Self-pay | Admitting: Family Medicine

## 2020-07-24 DIAGNOSIS — Z3042 Encounter for surveillance of injectable contraceptive: Secondary | ICD-10-CM | POA: Diagnosis not present

## 2020-07-27 ENCOUNTER — Other Ambulatory Visit: Payer: Self-pay | Admitting: Physician Assistant

## 2020-08-05 ENCOUNTER — Encounter: Payer: Self-pay | Admitting: Physician Assistant

## 2020-08-05 ENCOUNTER — Telehealth (INDEPENDENT_AMBULATORY_CARE_PROVIDER_SITE_OTHER): Payer: Federal, State, Local not specified - PPO | Admitting: Physician Assistant

## 2020-08-05 VITALS — Ht 68.0 in | Wt 153.0 lb

## 2020-08-05 DIAGNOSIS — D649 Anemia, unspecified: Secondary | ICD-10-CM

## 2020-08-05 DIAGNOSIS — I10 Essential (primary) hypertension: Secondary | ICD-10-CM | POA: Diagnosis not present

## 2020-08-05 NOTE — Progress Notes (Signed)
Virtual Visit via Video   I connected with Catherine Cox on 08/05/20 at  3:30 PM EDT by a video enabled telemedicine application and verified that I am speaking with the correct person using two identifiers. Location patient: Home Location provider: [facility], Office Persons participating in the virtual visit: Catherine Cox, Catherine Coke PA-C, Catherine Pickler, Catherine Cox   I discussed the limitations of evaluation and management by telemedicine and the availability of in person appointments. The patient expressed understanding and agreed to proceed.  I acted as a Education administrator for [employer], PA-C [employer], Catherine Cox   Subjective:   HPI:   Hypertension Pt following up, Lisinopril 62m -HCTZ 12.5  was discontinued in June when pt was at the hospital due to hypotension. She has been checking blood pressure at home averaging systolic 1681-157 diastolic 826-20 Pt denies headaches, dizziness, blurred vision, chest pain, SOB or lower leg edema. Denies excessive caffeine intake, stimulant usage, excessive alcohol intake or increase in salt consumption.  Anemia Patient was suspected to have acute GI bleed during ED visit on 06/01/20. Hgb was 9.4. She was recommended to be admitted but she left AMA. Since that time she denies having any further issues and is taking her oral iron regularly. Denies: lightheadedness, dizziness  ROS: See pertinent positives and negatives per HPI.  Patient Active Problem List   Diagnosis Date Noted  . Acute recurrent sinusitis 08/01/2017  . Migraine 11/22/2016    Social History   Tobacco Use  . Smoking status: Never Smoker  . Smokeless tobacco: Never Used  Substance Use Topics  . Alcohol use: Yes    Comment: occ    Current Outpatient Medications:  .  aspirin-acetaminophen-caffeine (EXCEDRIN MIGRAINE) 250-250-65 MG tablet, Take 2 tablets by mouth every 6 (six) hours as needed for headache., Disp: , Rfl:  .  cyclobenzaprine (FLEXERIL) 10 MG tablet, Take 10  mg by mouth 3 (three) times daily as needed for muscle spasms., Disp: , Rfl:  .  ferrous sulfate 325 (65 FE) MG tablet, Take 1 tablet (325 mg total) by mouth daily., Disp: 30 tablet, Rfl: 0 .  medroxyPROGESTERone (DEPO-PROVERA) 150 MG/ML injection, Inject 150 mg into the muscle. Every 8-10 weeks. , Disp: , Rfl:  .  pantoprazole (PROTONIX) 20 MG tablet, TAKE 1 TABLET BY MOUTH EVERY DAY, Disp: 30 tablet, Rfl: 5 .  rizatriptan (MAXALT) 10 MG tablet, Take 10 mg by mouth as needed for migraine. May repeat in 2 hours if needed, Disp: , Rfl:   Allergies  Allergen Reactions  . Codeine Anaphylaxis    Objective:   VITALS: Per patient if applicable, see vitals. GENERAL: Alert, appears well and in no acute distress. HEENT: Atraumatic, conjunctiva clear, no obvious abnormalities on inspection of external nose and ears. NECK: Normal movements of the head and neck. CARDIOPULMONARY: No increased WOB. Speaking in clear sentences. I:E ratio WNL.  MS: Moves all visible extremities without noticeable abnormality. PSYCH: Pleasant and cooperative, well-groomed. Speech normal rate and rhythm. Affect is appropriate. Insight and judgement are appropriate. Attention is focused, linear, and appropriate.  NEURO: CN grossly intact. Oriented as arrived to appointment on time with no prompting. Moves both UE equally.  SKIN: No obvious lesions, wounds, erythema, or cyanosis noted on face or hands.  Assessment and Plan:   TEzell[place]as seen today for hypertension.  Diagnoses and all orders for this visit:  Essential hypertension   Home blood pressure checks seem to be well controlled at this time. She will  continue to check her blood pressure periodically, and I have asked her to call us and let us know if numbers are >140/90 and if they are we will resume Lisinopril-HCTZ 20-12.5 mg.    Anemia I have also asked her to come in for labs to recheck her Hgb. She states that she will call back and schedule a lab only  visit.  . Reviewed expectations re: course of current medical issues. . Discussed self-management of symptoms. . Outlined signs and symptoms indicating need for more acute intervention. . Patient verbalized understanding and all questions were answered. [name] Health Maintenance issues including appropriate healthy diet, exercise, and smoking avoidance were discussed with patient. . See orders for this visit as documented in the electronic medical record.  I discussed the assessment and treatment plan with the patient. The patient was provided an opportunity to ask questions and all were answered. The patient agreed with the plan and demonstrated an understanding of the instructions.   The patient was advised to call back or seek an in-person evaluation if the symptoms worsen or if the condition fails to improve as anticipated.   CMA or Catherine Cox served as scribe during this visit. History, Physical, and Plan performed by medical provider. The above documentation has been reviewed and is accurate and complete.  Catherine Cox, [place] 08/05/2020

## 2020-08-06 ENCOUNTER — Telehealth: Payer: Self-pay | Admitting: Physician Assistant

## 2020-08-06 NOTE — Telephone Encounter (Signed)
Patient called in and wanted her note to be revised to take out about taking care of her mother and just to recommend she work from [employer] do to her health.

## 2020-08-06 NOTE — Telephone Encounter (Signed)
LVM to let patient know that [name] was out of the office till Tuesday and would have to wait till [name] was back.

## 2020-08-07 DIAGNOSIS — Z20822 Contact with and (suspected) exposure to covid-19: Secondary | ICD-10-CM | POA: Diagnosis not present

## 2020-08-11 ENCOUNTER — Telehealth: Payer: Self-pay | Admitting: Physician Assistant

## 2020-08-11 ENCOUNTER — Encounter: Payer: Self-pay | Admitting: Physician Assistant

## 2020-08-11 NOTE — Telephone Encounter (Signed)
Please see message from patient

## 2020-08-11 NOTE — Telephone Encounter (Signed)
Note has been reprinted and is at front desk for her to pick-up.

## 2020-08-11 NOTE — Telephone Encounter (Signed)
Patient dropped off a medical information and restriction assessment form to be completed.   Please call patient once completed.    I have placed inside Worley's folder up front.

## 2020-08-11 NOTE — Telephone Encounter (Signed)
Left message on voicemail note is ready for pickup will be at the front desk. Any questions please call office.

## 2020-08-12 NOTE — Telephone Encounter (Signed)
Received paperwork put in Catherine Cox's folder to complete.

## 2020-08-13 NOTE — Telephone Encounter (Signed)
Patient is following up regarding this paperwork and states she needs it ASAP  Thank you !

## 2020-08-14 NOTE — Telephone Encounter (Signed)
Please call pt and let her know Paperwork completed, put up front for pickup.

## 2020-08-14 NOTE — Telephone Encounter (Signed)
Patient has been called. Patient coming to pick up.

## 2020-08-17 ENCOUNTER — Other Ambulatory Visit: Payer: Self-pay

## 2020-08-17 ENCOUNTER — Encounter: Payer: Self-pay | Admitting: Physician Assistant

## 2020-08-17 ENCOUNTER — Telehealth: Payer: Federal, State, Local not specified - PPO | Admitting: Physician Assistant

## 2020-08-17 DIAGNOSIS — Z0289 Encounter for other administrative examinations: Secondary | ICD-10-CM

## 2020-08-17 NOTE — Progress Notes (Signed)
Patient was scheduled for a virtual visit.  When discussing need for visit, she just needed clarification of work-note.  I have provided work-note for patient.  No charge for today's visit. No acute needs identified at this time.  [name], PA-C

## 2020-08-20 ENCOUNTER — Telehealth: Payer: Self-pay

## 2020-08-20 NOTE — Telephone Encounter (Signed)
Okay to write note for patient per her request.

## 2020-08-20 NOTE — Telephone Encounter (Signed)
Please see message. °

## 2020-08-20 NOTE — Telephone Encounter (Signed)
Pt wants to let [name] know that she needs to be out of work next week also and is requesting documentation for this

## 2020-08-21 ENCOUNTER — Encounter: Payer: Self-pay | Admitting: Physician Assistant

## 2020-08-21 NOTE — Telephone Encounter (Signed)
Spoke to pt told her Catherine Cox said okay to write another excuse for work. Asked her if she needs same type of letter? Pt said yes. Told her okay I will do letter and put upfront for you to pickup. Pt verbalized understanding. Letter done.

## 2020-08-27 ENCOUNTER — Telehealth: Payer: Self-pay

## 2020-08-27 NOTE — Telephone Encounter (Signed)
Please call patient.  Is she having her usual sinus symptoms? ----If not -- needs virtual visit with me tomorrow. ----If she is -- may prescribe -->  cefUROXime (CEFTIN) 250 MG/5ML suspension; Take 5 mLs (250 mg total) by mouth 2 (two) times daily for 7 days.  Derrek Monaco confirm patient prefers this liquid regimen before sending in  Please tell patient that all patients with URI symptoms are being strongly encouraged to get COVID testing so please encourage patient to do so to help prevent the spread of COVID.

## 2020-08-27 NOTE — Telephone Encounter (Signed)
Please see message and advise 

## 2020-08-27 NOTE — Telephone Encounter (Signed)
Pt called back about an trying to get an appt tomorrow with [name] and her sinus infection and about returning to work. Told her yes she is booked tomorrow, let me see when she comes in the morning were we can put you in for a virtual visit and I will call you in the morning. Pt verbalized understanding. Asked her if this is her usual sinus infection? Pt said yes pressure across her head and congestion and had COVID test beginning of month was Negative, daughter had one today was Negative. Told her [name] said with URI should encourage you to get tested. Pt said I know I don't have COVID have not been around anyone. Told her okay talk to her tomorrow.

## 2020-08-27 NOTE — Telephone Encounter (Signed)
Pt is requesting an antibiotic refill for her sinus infection.   She also said to tell [name] that " her meeting is next week. " Pt states [name] will know what that means.

## 2020-08-28 ENCOUNTER — Other Ambulatory Visit: Payer: Self-pay | Admitting: Physician Assistant

## 2020-08-28 ENCOUNTER — Telehealth (INDEPENDENT_AMBULATORY_CARE_PROVIDER_SITE_OTHER): Payer: Federal, State, Local not specified - PPO | Admitting: Physician Assistant

## 2020-08-28 ENCOUNTER — Encounter: Payer: Self-pay | Admitting: Physician Assistant

## 2020-08-28 VITALS — Ht 68.0 in | Wt 153.0 lb

## 2020-08-28 DIAGNOSIS — R0981 Nasal congestion: Secondary | ICD-10-CM | POA: Diagnosis not present

## 2020-08-28 DIAGNOSIS — F430 Acute stress reaction: Secondary | ICD-10-CM

## 2020-08-28 MED ORDER — CEFUROXIME AXETIL 250 MG PO TABS: 250.0000 mg | ORAL_TABLET | Freq: Two times a day (BID) | ORAL | 0 refills | Status: AC

## 2020-08-28 MED ORDER — CEFTIN 250 MG/5ML PO SUSR: 250.0000 mg | Freq: Two times a day (BID) | ORAL | 0 refills | Status: DC

## 2020-08-28 NOTE — Progress Notes (Signed)
Virtual Visit via Video   I connected with Catherine Cox on 08/28/20 at 12:00 PM EDT by a video enabled telemedicine application and verified that I am speaking with the correct person using two identifiers. Location patient: Home Location provider: [facility], Office Persons participating in the virtual visit: Catherine Cox, [name] PA-C, Anselmo Pickler, LPN   I discussed the limitations of evaluation and management by telemedicine and the availability of in person appointments. The patient expressed understanding and agreed to proceed.  I acted as a Education administrator for [employer], [employer], LPN   Subjective:   HPI:   Sinus problem Pt c/o nasal congestion and sinus pressure x  3-4 days. She is experiencing headaches. Denies fever, chills, diarrhea, cough, changes in taste/smell. Pt took Sudafed last night. She has history of sinusitis and this is her typical symptoms.  Situational anxiety Patient reports ongoing stress with work. She is still seeking approval to work in [place] to prevent [place] commutes to [place]. She is asking for another weeklong extension from work for this.  ROS: See pertinent positives and negatives per HPI.  Patient Active Problem List   Diagnosis Date Noted  . Acute recurrent sinusitis 08/01/2017  . Migraine 11/22/2016    Social History   Tobacco Use  . Smoking status: Never Smoker  . Smokeless tobacco: Never Used  Substance Use Topics  . Alcohol use: Yes    Comment: occ    Current Outpatient Medications:  .  aspirin-acetaminophen-caffeine (EXCEDRIN MIGRAINE) 250-250-65 MG tablet, Take 2 tablets by mouth every 6 (six) hours as needed for headache., Disp: , Rfl:  .  cyclobenzaprine (FLEXERIL) 10 MG tablet, Take 10 mg by mouth 3 (three) times daily as needed for muscle spasms., Disp: , Rfl:  .  ferrous sulfate 325 (65 FE) MG tablet, Take 1 tablet (325 mg total) by mouth daily., Disp: 30 tablet, Rfl: 0 .   medroxyPROGESTERone (DEPO-PROVERA) 150 MG/ML injection, Inject 150 mg into the muscle. Every 8-10 weeks. , Disp: , Rfl:  .  rizatriptan (MAXALT) 10 MG tablet, Take 10 mg by mouth as needed for migraine. May repeat in 2 hours if needed, Disp: , Rfl:   Allergies  Allergen Reactions  . Codeine Anaphylaxis    Objective:   VITALS: Per patient if applicable, see vitals. GENERAL: Alert, appears well and in no acute distress. HEENT: Atraumatic, conjunctiva clear, no obvious abnormalities on inspection of external nose and ears. NECK: Normal movements of the head and neck. CARDIOPULMONARY: No increased WOB. Speaking in clear sentences. I:E ratio WNL.  MS: Moves all visible extremities without noticeable abnormality. PSYCH: Pleasant and cooperative, well-groomed. Speech normal rate and rhythm. Affect is appropriate. Insight and judgement are appropriate. Attention is focused, linear, and appropriate.  NEURO: CN grossly intact. Oriented as arrived to appointment on time with no prompting. Moves both UE equally.  SKIN: No obvious lesions, wounds, erythema, or cyanosis noted on face or hands.  Assessment and Plan:   [name] was seen today for sinus problem.  Diagnoses and all orders for this visit:  Sinus congestion No red flags on exam.  Will initiate cefuroxime 250 mg/ml BID x 7 days per orders. Discussed taking medications as prescribed. Reviewed return precautions including worsening fever, SOB, worsening cough or other concerns. Push fluids and rest. I recommend that patient follow-up if symptoms worsen or persist despite treatment x 7-10 days, sooner if needed.  Stress reaction Work note provided for patient today. Continue to work  on stress-reducing techniques as able.  I discussed the assessment and treatment plan with the patient. The patient was provided an opportunity to ask questions and all were answered. The patient agreed with the plan and demonstrated an understanding of the  instructions.   The patient was advised to call back or seek an in-person evaluation if the symptoms worsen or if the condition fails to improve as anticipated.   CMA or LPN served as scribe during this visit. History, Physical, and Plan performed by medical provider. The above documentation has been reviewed and is accurate and complete.  [place], [place] 08/28/2020

## 2020-08-28 NOTE — Telephone Encounter (Signed)
Pt added to schedule for virtual visit.

## 2020-09-01 DIAGNOSIS — G43719 Chronic migraine without aura, intractable, without status migrainosus: Secondary | ICD-10-CM | POA: Diagnosis not present

## 2020-09-01 DIAGNOSIS — G43019 Migraine without aura, intractable, without status migrainosus: Secondary | ICD-10-CM | POA: Diagnosis not present

## 2020-09-07 ENCOUNTER — Telehealth: Payer: Self-pay

## 2020-09-07 NOTE — Telephone Encounter (Signed)
Pt called stating her pantoprazole sodium 20 MG needs to be authorized for insurance. Pt states this number can be called to get RX authorized: [phone]. Please advise.

## 2020-09-08 NOTE — Telephone Encounter (Signed)
Left message on voicemail to call office.

## 2020-09-08 NOTE — Telephone Encounter (Signed)
Patient is returning Donna's call.

## 2020-09-08 NOTE — Telephone Encounter (Signed)
Spoke to pt told her Catherine Cox said she can use OTC Nexium or Prilosec 20 mg or can get Protonix 20 mg with Good [facility] card. Pt verbalized understanding.

## 2020-09-10 ENCOUNTER — Other Ambulatory Visit: Payer: Self-pay | Admitting: *Deleted

## 2020-09-10 MED ORDER — PANTOPRAZOLE SODIUM 20 MG PO TBEC: 20.0000 mg | DELAYED_RELEASE_TABLET | Freq: Every day | ORAL | 3 refills | Status: DC

## 2020-09-18 DIAGNOSIS — Z3042 Encounter for surveillance of injectable contraceptive: Secondary | ICD-10-CM | POA: Diagnosis not present

## 2020-10-19 ENCOUNTER — Telehealth: Payer: Self-pay

## 2020-10-19 NOTE — Telephone Encounter (Signed)
Spoke to pt said she was suppose to call if she had to take her blood pressure medicine. Pt said Saturday Bp was 150/92 and she took her medicine Lisinopril 20 mg-HCTZ 12.5 mg and she felt a little lightheaded. Pt has not taken anything in 5 months. Bp came down to 114/70. She wanted to know if she can have a lower dose so she does not feel lightheaded? Told pt will send message to [facility] and get back to her. Pt verbalized understanding.

## 2020-10-19 NOTE — Telephone Encounter (Signed)
Spoke to pt told her [name] would like to see patient in office. We need to update labs and can check blood pressure while in office to make sure nothing else is contributing to this. 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 Told pt yes, we have not seen you in months and need to update your labs. Pt said okay, appt scheduled for Friday at 2:30 pm.

## 2020-10-19 NOTE — Telephone Encounter (Signed)
[name] please see message and advise.

## 2020-10-19 NOTE — Telephone Encounter (Signed)
Pt would like to be called in regards to blood pressure medication

## 2020-10-19 NOTE — Telephone Encounter (Signed)
I would like to see patient in office  We need to update labs and can check blood pressure while in office to make sure nothing else is contributing to this

## 2020-10-23 ENCOUNTER — Ambulatory Visit (INDEPENDENT_AMBULATORY_CARE_PROVIDER_SITE_OTHER): Payer: Federal, State, Local not specified - PPO | Admitting: Physician Assistant

## 2020-10-23 ENCOUNTER — Encounter: Payer: Self-pay | Admitting: Physician Assistant

## 2020-10-23 VITALS — BP 160/100 | HR 96 | Temp 98.0°F | Ht 68.0 in | Wt 157.5 lb

## 2020-10-23 DIAGNOSIS — D649 Anemia, unspecified: Secondary | ICD-10-CM

## 2020-10-23 DIAGNOSIS — I10 Essential (primary) hypertension: Secondary | ICD-10-CM | POA: Diagnosis not present

## 2020-10-23 DIAGNOSIS — Z136 Encounter for screening for cardiovascular disorders: Secondary | ICD-10-CM

## 2020-10-23 DIAGNOSIS — Z13220 Encounter for screening for lipoid disorders: Secondary | ICD-10-CM

## 2020-10-23 DIAGNOSIS — Z1159 Encounter for screening for other viral diseases: Secondary | ICD-10-CM

## 2020-10-23 DIAGNOSIS — Z114 Encounter for screening for human immunodeficiency virus [HIV]: Secondary | ICD-10-CM

## 2020-10-23 MED ORDER — LISINOPRIL 5 MG PO TABS
5.0000 mg | ORAL_TABLET | Freq: Every day | ORAL | 3 refills | Status: DC
Start: 2020-10-23 — End: 2020-12-19

## 2020-10-23 NOTE — Patient Instructions (Addendum)
It was great to see you!  Goal BP is 140/90 or lower  Let's stop current med and only do lisinopril 5 mg. Follow-up virtually in 3 months, sooner if concerns.  Happy thanksgiving!  Catherine Cox

## 2020-10-23 NOTE — Progress Notes (Signed)
Catherine Cox is a 44 y.o. female is here for follow up.  I acted as a Education administrator for [employer], PA-C Catherine Pickler, Catherine Cox   History of Present Illness:   Chief Complaint  Patient presents with  . Hypertension    HPI   Hypertension Pt here for follow up on blood pressure. She had to take her blood pressure medicine on Saturday 11/13 due to BP was 150/92 and she took her medicine Lisinopril 20 mg-HCTZ 12.5 mg and she felt a little lightheaded. Pt has not taken anything in 5 months. Bp came down to 114/70.  Pt said she would like to discuss lower dose of medication. Pt says Bp has been averaging 130/82 since Monday. Pt denies headaches, dizziness, blurred vision, chest pain, SOB or lower leg edema. Denies excessive caffeine intake, stimulant usage, excessive alcohol intake or increase in salt consumption.  Lipid screening She is overdue for lipid screening. Will update today.   Anemia Does have history of anemia. CBC in June 2021 showed Hgb of 9.4%. She does take iron regularly. Denies: overt blood loss, fatigue, lightheadedness, dizziness.  Health Maintenance Due  Topic Date Due  . Hepatitis C Screening  Never done  . MAMMOGRAM  05/02/2019    Past Medical History:  Diagnosis Date  . Environmental allergies   . Migraine headache    [facility] Catherine Cox, has been going since 2004     Social History   Tobacco Use  . Smoking status: Never Smoker  . Smokeless tobacco: Never Used  Substance Use Topics  . Alcohol use: Yes    Comment: occ  . Drug use: No    Past Surgical History:  Procedure Laterality Date  . TONSILLECTOMY      Family History  Problem Relation Age of Onset  . Alzheimer's disease Mother   . Cancer Father        lymphoma and lung  . Breast cancer Neg Hx   . Colon cancer Neg Hx     PMHx, SurgHx, SocialHx, FamHx, Medications, and Allergies were reviewed in the Visit Navigator and updated as appropriate.   Patient Active  Problem List   Diagnosis Date Noted  . Acute recurrent sinusitis 08/01/2017  . Migraine 11/22/2016    Social History   Tobacco Use  . Smoking status: Never Smoker  . Smokeless tobacco: Never Used  Substance Use Topics  . Alcohol use: Yes    Comment: occ  . Drug use: No    Current Medications and Allergies:    Current Outpatient Medications:  .  aspirin-acetaminophen-caffeine (EXCEDRIN MIGRAINE) 250-250-65 MG tablet, Take 2 tablets by mouth every 6 (six) hours as needed for headache., Disp: , Rfl:  .  cyclobenzaprine (FLEXERIL) 10 MG tablet, Take 10 mg by mouth 3 (three) times daily as needed for muscle spasms., Disp: , Rfl:  .  ferrous sulfate 325 (65 FE) MG tablet, Take 1 tablet (325 mg total) by mouth daily., Disp: 30 tablet, Rfl: 0 .  medroxyPROGESTERone (DEPO-PROVERA) 150 MG/ML injection, Inject 150 mg into the muscle. Every 8-10 weeks. , Disp: , Rfl:  .  pantoprazole (PROTONIX) 20 MG tablet, Take 1 tablet (20 mg total) by mouth daily., Disp: 90 tablet, Rfl: 3 .  rizatriptan (MAXALT) 10 MG tablet, Take 10 mg by mouth as needed for migraine. May repeat in 2 hours if needed, Disp: , Rfl:  .  lisinopril (ZESTRIL) 5 MG tablet, Take 1 tablet (5 mg total) by mouth daily.,  Disp: 90 tablet, Rfl: 3   Allergies  Allergen Reactions  . Codeine Anaphylaxis    Review of Systems   ROS  Negative unless otherwise specified per HPI.  Vitals:   Vitals:   10/23/20 1422  BP: (!) 160/100  Pulse: 96  Temp: 98 F (36.7 C)  TempSrc: Temporal  SpO2: 98%  Weight: 157 lb 8 oz (71.4 kg)  Height: 5' 8"  (1.727 m)     Body mass index is 23.95 kg/m.   Physical Exam:    Physical Exam Vitals and nursing note reviewed.  Constitutional:      General: She is not in acute distress.    Appearance: She is well-developed. She is not ill-appearing or toxic-appearing.  Cardiovascular:     Rate and Rhythm: Normal rate and regular rhythm.     Pulses: Normal pulses.     Heart sounds: Normal  heart sounds, S1 normal and S2 normal.     Comments: No LE edema Pulmonary:     Effort: Pulmonary effort is normal.     Breath sounds: Normal breath sounds.  Skin:    General: Skin is warm and dry.  Neurological:     Mental Status: She is alert.     GCS: GCS eye subscore is 4. GCS verbal subscore is 5. GCS motor subscore is 6.  Psychiatric:        Speech: Speech normal.        Behavior: Behavior normal. Behavior is cooperative.      Assessment and Plan:    Orine was seen today for hypertension.  Diagnoses and all orders for this visit:  Essential hypertension Well controlled at home. She is quite sensitive to this medication, stop Lisinopril 20-HCTZ 12.5 mg. Start Lisinopril 5 mg daily. Goal BP is <140/90. If getting numbers consistently greater than this, recommend increasing to Lisinopril 10 mg daily. Follow-up in 3 months, sooner if concerns. -     CBC with Differential/Platelet; Future -     Comprehensive metabolic panel; Future -     Comprehensive metabolic panel -     CBC with Differential/Platelet  Anemia Update Hgb today to determine need for supplementation change if indicated.  Screening for HIV (human immunodeficiency virus) -     HIV Antibody (routine testing w rflx); Future -     HIV Antibody (routine testing w rflx)  Encounter for screening for other viral diseases -     Hepatitis C antibody; Future -     Hepatitis C antibody  Encounter for lipid screening for cardiovascular disease -     Lipid panel; Future -     Lipid panel  Other orders -     lisinopril (ZESTRIL) 5 MG tablet; Take 1 tablet (5 mg total) by mouth daily.   Catherine Cox or Catherine Cox served as scribe during this visit. History, Physical, and Plan performed by medical provider. The above documentation has been reviewed and is accurate and complete.  Catherine Coke, PA-C [place], [place] 10/23/2020  Follow-up: No follow-ups on file.

## 2020-10-26 LAB — CBC WITH DIFFERENTIAL/PLATELET
Absolute Monocytes: 350 cells/uL (ref 200–950)
Basophils Absolute: 42 cells/uL (ref 0–200)
Basophils Relative: 0.6 %
Eosinophils Absolute: 469 cells/uL (ref 15–500)
Eosinophils Relative: 6.7 %
HCT: 38.6 % (ref 35.0–45.0)
Hemoglobin: 13.1 g/dL (ref 11.7–15.5)
Lymphs Abs: 3031 cells/uL (ref 850–3900)
MCH: 28.7 pg (ref 27.0–33.0)
MCHC: 33.9 g/dL (ref 32.0–36.0)
MCV: 84.5 fL (ref 80.0–100.0)
MPV: 10.3 fL (ref 7.5–12.5)
Monocytes Relative: 5 %
Neutro Abs: 3108 cells/uL (ref 1500–7800)
Neutrophils Relative %: 44.4 %
Platelets: 285 10*3/uL (ref 140–400)
RBC: 4.57 10*6/uL (ref 3.80–5.10)
RDW: 15.2 % — ABNORMAL HIGH (ref 11.0–15.0)
Total Lymphocyte: 43.3 %
WBC: 7 10*3/uL (ref 3.8–10.8)

## 2020-10-26 LAB — LIPID PANEL
Cholesterol: 169 mg/dL (ref <200)
HDL: 53 mg/dL (ref >=50)
LDL Cholesterol (Calc): 97 mg/dL (calc)
Non-HDL Cholesterol (Calc): 116 mg/dL (calc) (ref <130)
Total CHOL/HDL Ratio: 3.2 (calc) (ref <5.0)
Triglycerides: 92 mg/dL (ref <150)

## 2020-10-26 LAB — COMPREHENSIVE METABOLIC PANEL
AG Ratio: 1.4 (calc) (ref 1.0–2.5)
ALT: 12 U/L (ref 6–29)
AST: 15 U/L (ref 10–35)
Albumin: 3.9 g/dL (ref 3.6–5.1)
Alkaline phosphatase (APISO): 64 U/L (ref 31–125)
BUN: 11 mg/dL (ref 7–25)
CO2: 21 mmol/L (ref 20–32)
Calcium: 9 mg/dL (ref 8.6–10.2)
Chloride: 106 mmol/L (ref 98–110)
Creat: 0.89 mg/dL (ref 0.50–1.10)
Globulin: 2.7 g/dL (calc) (ref 1.9–3.7)
Glucose, Bld: 114 mg/dL — ABNORMAL HIGH (ref 65–99)
Potassium: 3.8 mmol/L (ref 3.5–5.3)
Sodium: 138 mmol/L (ref 135–146)
Total Bilirubin: 0.4 mg/dL (ref 0.2–1.2)
Total Protein: 6.6 g/dL (ref 6.1–8.1)

## 2020-10-26 LAB — HEPATITIS C ANTIBODY
Hepatitis C Ab: NONREACTIVE
SIGNAL TO CUT-OFF: 0.02 (ref <1.00)

## 2020-10-26 LAB — HIV ANTIBODY (ROUTINE TESTING W REFLEX): HIV 1&2 Ab, 4th Generation: NONREACTIVE

## 2020-11-04 ENCOUNTER — Encounter: Payer: Self-pay | Admitting: Physician Assistant

## 2020-11-10 ENCOUNTER — Telehealth: Payer: Self-pay

## 2020-11-10 MED ORDER — NEBIVOLOL HCL 5 MG PO TABS
5.0000 mg | ORAL_TABLET | Freq: Every day | ORAL | 2 refills | Status: DC
Start: 2020-11-10 — End: 2020-12-19

## 2020-11-10 NOTE — Telephone Encounter (Signed)
Please see message and advise 

## 2020-11-10 NOTE — Telephone Encounter (Signed)
I need clarification as to why she thinks that the lisinopril 5 mg is not working.  If her blood pressure is not responding to this, I would prefer to increase the lisinopril instead of starting a new medication.  Is she having side effects from this medication?  If she is having side effects from the lisinopril, I am agreeable to trialing the Bystolic and stopping lisinopril.  If we change to Bystolic, I would like to see her in the office in 1 to 2 months, to follow-up on all of this.

## 2020-11-10 NOTE — Addendum Note (Signed)
Addended by: Marian Sorrow on: 11/10/2020 04:16 PM   Modules accepted: Orders

## 2020-11-10 NOTE — Telephone Encounter (Signed)
Pt called told her sorry did not get back to her sooner. [name] wants to know, needs clarification as to why she thinks that the lisinopril 5 mg is not working? Pt says it is still high. Asked pt what has it been running? Pt said 150 something. Asked pt what bottom number is running? Pt said not sure but yesterday Bp was 150/103.  [name] said If your blood pressure is not responding to this, she would prefer to increase the lisinopril instead of starting a new medication.  Is she having side effects from this medication?  Pt verbalized understanding but said she is not having any side effects other than not working. Pt says she has tried taking Lisinopril 10 mg every other day and still no improvement. Also was on Lisinopril 20 mg and it bottomed her pressure out. Pt says she just wants to start Bystolic 5 mg daily and see if that helps. Told her okay will send in Bystolic 5 mg daily. [name] said if we change to Bystolic, I would like to see her in the office in 1 to 2 months, to follow-up on all of this. Pt verbalized understanding. New Rx sent to pharmacy.

## 2020-11-10 NOTE — Telephone Encounter (Signed)
Pt did research and she would like to try the Bystolic 5 MG. She has been taken 2 tablets of the lisinopril and states it still isn't working

## 2020-11-17 DIAGNOSIS — Z3042 Encounter for surveillance of injectable contraceptive: Secondary | ICD-10-CM | POA: Diagnosis not present

## 2020-12-08 ENCOUNTER — Telehealth: Payer: Self-pay

## 2020-12-08 NOTE — Telephone Encounter (Signed)
Pt called again spoke to pt said she has been on Lisinopril for 3 weeks and blood pressure is not coming down it is still high. Pt said it was 150/98 yesterday and it has been averaging 138/98. Told pt she needs to make an appointment to have blood pressure evaluated. Pt verbalized understanding and will call back and schedule.

## 2020-12-08 NOTE — Telephone Encounter (Signed)
Pt is requesting a call from [place].

## 2020-12-18 ENCOUNTER — Telehealth: Payer: Self-pay

## 2020-12-18 NOTE — Telephone Encounter (Signed)
Please call pt an schedule her an appt with another provider today or tomorrow for her blood pressure per [name]. We do not have any openings.

## 2020-12-18 NOTE — Telephone Encounter (Signed)
Patients states that BP  148/96 while taking current BP medications. Patient would like to know if we can work her in today for a virtual appt, Patient was offered virtual appt Monday and declined   Please advise  Thank you.

## 2020-12-18 NOTE — Telephone Encounter (Signed)
Patient is schedule with Alma Friendly tomorrow.

## 2020-12-18 NOTE — Telephone Encounter (Signed)
Patient

## 2020-12-19 ENCOUNTER — Encounter: Payer: Self-pay | Admitting: Primary Care

## 2020-12-19 ENCOUNTER — Telehealth (INDEPENDENT_AMBULATORY_CARE_PROVIDER_SITE_OTHER): Payer: Federal, State, Local not specified - PPO | Admitting: Primary Care

## 2020-12-19 VITALS — Ht 68.0 in | Wt 156.0 lb

## 2020-12-19 DIAGNOSIS — I10 Essential (primary) hypertension: Secondary | ICD-10-CM

## 2020-12-19 MED ORDER — HYDROCHLOROTHIAZIDE 12.5 MG PO TABS: 12.5000 mg | ORAL_TABLET | Freq: Every day | ORAL | 0 refills | Status: DC

## 2020-12-19 NOTE — Patient Instructions (Signed)
  Stop taking Bystolic 5 mg for blood pressure.  Start taking hydrochlorothiazide 12.5 mg once daily for blood pressure.  Start monitoring your blood pressure daily, around the same time of day, for the next 2-3 weeks.  Ensure that you have rested for 30 minutes prior to checking your blood pressure. Record your readings and bring them to your next visit.  Please set up a follow up visit with [facility] for 2 weeks.  It was a pleasure meeting you! Allie Bossier, NP-C

## 2020-12-19 NOTE — Assessment & Plan Note (Signed)
Little improvement with Bystolic 5 mg, and lisinopril-HCTZ 20-12.5 mg was too potent. Given her family history of hypertension coupled with stress and readings, she certainly needs treatment.  Discussed options together, we decided to trial HCTZ 12.5 mg alone. Stop Bystolic 5 mg. Start HCTZ 12.5 mg. She will check BP once daily at home to monitor, she understands that it may take 1-2 weeks for BP to remain consistently lower/at goal.  I told her to call her PCP's office to set up a follow up visit for 2 weeks, she verbalized understanding.

## 2020-12-19 NOTE — Progress Notes (Signed)
Subjective:    Patient ID: Catherine Cox, female    DOB: 11/14/1976, 45 y.o.   MRN: [id number]  HPI  Virtual Visit via Video Note  I connected with Catherine Cox on 12/19/20 at  9:20 AM EST by a video enabled telemedicine application and verified that I am speaking with the correct person using two identifiers.  Location: Patient: Home Provider: Office Participants: Patient and myself   I discussed the limitations of evaluation and management by telemedicine and the availability of in person appointments. The patient expressed understanding and agreed to proceed.  We attempted to connect via video but she could not enable her camera. We conducted >50% of our visit via phone which lasted 11 min.   History of Present Illness:  Catherine Cox is a 45 year old female patient of [name] with a history of hypertension and migraines who presents today with a chief complaint of elevated blood pressure readings.   Last evaluated by PCP on 10/23/20 for follow up of hypertension. During that visit the patient requested a lower blood pressure medication regimen as lisinopril-HCTZ 20-12.5 mg caused lightheadedness. She hadn't had her medication in 5 months, since her episode of syncope with hypotension in June 2021. She took one dose of her lisinopril-HCTZ 20-12.5 mg and blood pressure did reduce to 114/70 with averages of 130/80's. During this visit her lisinopril-HCTZ 20-12.5 mg was discontinued and she was switched to lisinopril 5 mg. She was asked to follow up in three months.  She called her PCP's office on 12/08/20 with reports of elevated blood pressure readings despite three weeks of Bystolic 5 mg,  BP readings of 150/98 and 138/98. She was asked to schedule an appointment. She called back on 12/18/20 for a same day appointment for elevated BP readings of 148/96 and was deferred to our Saturday clinic. Her PCP had offered her an appointment several days prior but she had declined.   Today  she endorses she never started the lisinopril 5 mg, has been taking her Bystolic 5 mg daily for 3 weeks, BP's are running 130's/90's, with HR in the 70's, and is having some headaches for which she attributes to allergies. Today her BP is 154/98, has not taken any medication.    She has a family history of hypertension in her mother. She admits to a lot of personal stress for which she believes is contributing to her elevated readings. She's never had a problem with her blood pressure prior to 2021. She underwent labs in November 2021.  BP Readings from Last 3 Encounters:  10/23/20 (!) 160/100  06/01/20 116/84  12/13/19 (!) 166/110      Observations/Objective:  Alert and oriented. Appears well. No distress. Speaking in complete sentences.   Assessment and Plan:  Little improvement with Bystolic 5 mg, and lisinopril-HCTZ 20-12.5 mg was too potent. Given her family history of hypertension coupled with stress and readings, she certainly needs treatment.  Discussed options together, we decided to trial HCTZ 12.5 mg alone. Stop Bystolic 5 mg. Start HCTZ 12.5 mg. She will check BP once daily at home to monitor, she understands that it may take 1-2 weeks for BP to remain consistently lower/at goal.  I told her to call her PCP's office to set up a follow up visit for 2 weeks, she verbalized understanding.  Follow Up Instructions:  Stop taking Bystolic 5 mg for blood pressure.  Start taking hydrochlorothiazide 12.5 mg once daily for blood pressure.  Start monitoring your  blood pressure daily, around the same time of day, for the next 2-3 weeks.  Ensure that you have rested for 30 minutes prior to checking your blood pressure. Record your readings and bring them to your next visit.  Please set up a follow up visit with [facility] for 2 weeks.  It was a pleasure meeting you! Allie Bossier, NP-C    I discussed the assessment and treatment plan with the patient. The patient was provided an  opportunity to ask questions and all were answered. The patient agreed with the plan and demonstrated an understanding of the instructions.   The patient was advised to call back or seek an in-person evaluation if the symptoms worsen or if the condition fails to improve as anticipated.    Pleas Koch, NP    Review of Systems  Eyes: Negative for visual disturbance.  Respiratory: Negative for shortness of breath.   Cardiovascular: Negative for chest pain.  Neurological: Positive for headaches.  Psychiatric/Behavioral: The patient is nervous/anxious.        Past Medical History:  Diagnosis Date  . Environmental allergies   . Migraine headache    [facility] Dr[facility] Domingo [facility], has been going since 2004     Social History   Socioeconomic History  . Marital status: Single    Spouse name: Not on file  . Number of children: Not on file  . Years of education: Not on file  . Highest education level: Not on file  Occupational History  . Not on file  Tobacco Use  . Smoking status: Never Smoker  . Smokeless tobacco: Never Used  Substance and Sexual Activity  . Alcohol use: Yes    Comment: occ  . Drug use: No  . Sexual activity: Yes    Birth control/protection: Injection  Other Topics Concern  . Not on file  Social History Narrative   Research scientist (medical) with [employer]   2 chlidren - 37 and 47 y/o   Single   Social Determinants of Radio broadcast assistant Strain: Not on file  Food Insecurity: Not on file  Transportation Needs: Not on file  Physical Activity: Not on file  Stress: Not on file  Social Connections: Not on file  Intimate Partner Violence: Not on file    Past Surgical History:  Procedure Laterality Date  . TONSILLECTOMY      Family History  Problem Relation Age of Onset  . Alzheimer's disease Mother   . Hypertension Mother   . Cancer Father        lymphoma and lung  . Breast cancer Neg Hx   . Colon cancer Neg Hx      Allergies  Allergen Reactions  . Codeine Anaphylaxis    Current Outpatient Medications on File Prior to Visit  Medication Sig Dispense Refill  . aspirin-acetaminophen-caffeine (EXCEDRIN MIGRAINE) 250-250-65 MG tablet Take 2 tablets by mouth every 6 (six) hours as needed for headache.    . cyclobenzaprine (FLEXERIL) 10 MG tablet Take 10 mg by mouth 3 (three) times daily as needed for muscle spasms.    . ferrous sulfate 325 (65 FE) MG tablet Take 1 tablet (325 mg total) by mouth daily. 30 tablet 0  . medroxyPROGESTERone (DEPO-PROVERA) 150 MG/ML injection Inject 150 mg into the muscle. Every 8-10 weeks.     . nebivolol (BYSTOLIC) 5 MG tablet Take 1 tablet (5 mg total) by mouth daily. 30 tablet 2  . pantoprazole (PROTONIX) 20 MG tablet Take 1 tablet (  20 mg total) by mouth daily. 90 tablet 3  . rizatriptan (MAXALT) 10 MG tablet Take 10 mg by mouth as needed for migraine. May repeat in 2 hours if needed     No current facility-administered medications on file prior to visit.    Ht 5' 8"  (1.727 m)   Wt 156 lb (70.8 kg)   BMI 23.72 kg/m    Objective:   Physical Exam Pulmonary:     Effort: Pulmonary effort is normal.  Neurological:     Mental Status: She is alert and oriented to person, place, and time.  Psychiatric:     Comments: Appears anxious regarding BP readings            Assessment & Plan:

## 2021-01-07 ENCOUNTER — Other Ambulatory Visit: Payer: Self-pay | Admitting: Physician Assistant

## 2021-01-08 DIAGNOSIS — Z3042 Encounter for surveillance of injectable contraceptive: Secondary | ICD-10-CM | POA: Diagnosis not present

## 2021-01-10 ENCOUNTER — Other Ambulatory Visit: Payer: Self-pay | Admitting: Primary Care

## 2021-01-10 DIAGNOSIS — I10 Essential (primary) hypertension: Secondary | ICD-10-CM

## 2021-01-10 NOTE — Telephone Encounter (Signed)
Sending to  PCP

## 2021-01-11 MED ORDER — PANTOPRAZOLE SODIUM 20 MG PO TBEC: 20.0000 mg | DELAYED_RELEASE_TABLET | Freq: Every day | ORAL | 1 refills | Status: DC

## 2021-01-11 MED ORDER — HYDROCHLOROTHIAZIDE 12.5 MG PO TABS
12.5000 mg | ORAL_TABLET | Freq: Every day | ORAL | 1 refills | Status: DC
Start: 2021-01-11 — End: 2021-03-04

## 2021-01-11 NOTE — Addendum Note (Signed)
Addended by: Marian Sorrow on: 01/11/2021 04:18 PM   Modules accepted: Orders

## 2021-01-11 NOTE — Telephone Encounter (Signed)
Patient was instructed to follow-up with [place] regarding her new blood pressure medication regimen.  Please call patient and see what her blood pressures are doing with her new regimen of HCTZ 12.5 mg daily. I would like to know this before I refill this medication.

## 2021-01-11 NOTE — Telephone Encounter (Signed)
Spoke to pt asked her how the new blood pressure medication is working? I have you were suppose to f/u. Pt said it is working well systolic 121-975, diastolic 88-32. Pt denies headaches, dizziness, blurred vision, chest pain, SOB or lower leg edema. Pt said she also needs refill on Protonix and to send to [facility]. Told pt okay, please schedule f/u next month with [name]. Pt verbalized understanding. Rx's sent.

## 2021-01-28 ENCOUNTER — Telehealth (INDEPENDENT_AMBULATORY_CARE_PROVIDER_SITE_OTHER): Payer: Federal, State, Local not specified - PPO | Admitting: Family Medicine

## 2021-01-28 DIAGNOSIS — R0981 Nasal congestion: Secondary | ICD-10-CM | POA: Diagnosis not present

## 2021-01-28 DIAGNOSIS — R519 Headache, unspecified: Secondary | ICD-10-CM

## 2021-01-28 MED ORDER — AMOXICILLIN-POT CLAVULANATE 875-125 MG PO TABS
1.0000 | ORAL_TABLET | Freq: Two times a day (BID) | ORAL | 0 refills | Status: DC
Start: 2021-01-28 — End: 2021-03-16

## 2021-01-28 NOTE — Patient Instructions (Addendum)
  HOME CARE TIPS:  -[place] testing information: https://www.[facility].org/ OR [phone] Most pharmacies also offer testing and home test kits. If the covid test is positive please schedule a prompt follow up video visit with your Primary Care office or with [place].   -I sent the medication(s) we discussed to your pharmacy: Meds ordered this encounter  Medications  . amoxicillin-clavulanate (AUGMENTIN) 875-125 MG tablet    Sig: Take 1 tablet by mouth 2 (two) times daily.    Dispense:  20 tablet    Refill:  0     -can use tylenol or aleve if needed for fevers, aches and pains per instructions  -take you allergy pill daily  -can use nasal saline a few times per day if you have nasal congestion; sometimes  a short course of Afrin nasal spray for 3 days can help with symptoms as well  -stay hydrated, drink plenty of fluids and eat small healthy meals - avoid dairy  -can take 1000 IU (45mg) Vit D3 and 100-500 mg of Vit C daily per instructions  -If the Covid test is positive, check out the CDC website for more information on home care, transmission and treatment for COVID19  -follow up with your doctor in 2-3 days unless improving and feeling better  -stay home while sick, except to seek medical care, and if you have [place] it would be best to stay home for a full 10 days since the onset of symptoms PLUS one day of no fever and feeling better. Wear a good mask (such as N95 or KN95) if around others to reduce the risk of transmission.  It was nice to meet you today, and I really hope you are feeling better soon. I help [place] out with telemedicine visits on Tuesdays and Thursdays and am available for visits on those days. If you have any concerns or questions following this visit please schedule a follow up visit with your Primary Care doctor or seek care at a local urgent care clinic to avoid delays in care.    Seek in  person care or schedule a follow up video visit promptly if your symptoms worsen, new concerns arise or you are not improving with treatment. Call 911 and/or seek emergency care if your symptoms are severe or life threatening.

## 2021-01-28 NOTE — Progress Notes (Signed)
Virtual Visit via Telephone Note  I connected with Catherine Cox on 01/28/21 at  1:20 PM EST by telephone and verified that I am speaking with the correct person using two identifiers.   I discussed the limitations, risks, security and privacy concerns of performing an evaluation and management service by telephone and the availability of in person appointments. I also discussed with the patient that there may be a patient responsible charge related to this service. The patient expressed understanding and agreed to proceed.  Location patient: home, [place] Location provider: work or home office Participants present for the call: patient, provider Patient did not have a visit with me in the prior 7 days to address this/these issue(s).   History of Present Illness:  Acute telemedicine visit for : -Onset: chronic sinus issues worse the last few weeks on and off with allergies, but worsening the last 1-2 days -Symptoms include: now with nasal congestion worse with streaks of blood and sinus discomfort  -reports her blood pressure has been great recently -she is adamant about needing an abx - reports her PCP knows her history and know she needs an abx when she gets these symptoms as has hx of bad sinus infections -Denies: fevers, body aches, NVD, CP, SOB -Has tried: sudafed -Pertinent past medical history: sinus infections, chronic allergy issues - taking zyrtec -Pertinent medication allergies: codeine -COVID-19 vaccine status: 2 covid vaccines recently   Observations/Objective: Patient sounds cheerful and well on the phone. I do not appreciate any SOB. Speech and thought processing are grossly intact. Patient reported vitals:  Assessment and Plan:  Nasal sinus congestion  Facial discomfort  -we discussed possible serious and likely etiologies, options for evaluation and workup, limitations of telemedicine visit vs in person visit, treatment, treatment risks and precautions. Pt prefers to  treat via telemedicine empirically rather than in person at this moment.  Query new viral upper respiratory illness or Covid versus other with chronic underlying allergies, versus developing sinusitis, versus other.  She is adamant about taking an antibiotic.  We did have a lengthy discussion about the risk versus benefits of antibiotics and the fact that this could be a viral illness.  She prefers to take an antibiotic in case this is a bacterial sinusitis.  Discussed other measures she could take including nasal saline and allergy treatments to try first.  Sent Augmentin 875 to take twice daily for 10 days in case worsening or not improving given her adamant desire to do so in full understanding of risks.  Also advised Covid testing and discussed treatment options, potential complications, precautions and isolation in case this is positive.  Did advise that she schedule follow-up video visit with her PCP office or with  if she gets a positive Covid test. Work/School slipped offered:  declined Scheduled follow up with PCP offered: She agrees to schedule follow-up with her PCP if needed. Advised to seek prompt in person care if worsening, new symptoms arise, or if is not improving with treatment. Advised of options for inperson care in case PCP office not available. Did let the patient know that I only do telemedicine shifts for [place] on Tuesdays and Thursdays and advised a follow up visit with PCP or at an [facility] if has further questions or concerns.   Follow Up Instructions:  I did not refer this patient for an OV with me in the next 24 hours for this/these issue(s).  I discussed the assessment and treatment plan with the patient. The patient  was provided an opportunity to ask questions and all were answered. The patient agreed with the plan and demonstrated an understanding of the instructions.   I spent 15 minutes on the date of this visit in the care of this patient. See summary of tasks  completed to properly care for this patient in the detailed notes above which also included counseling of above, review of PMH, medications, allergies, evaluation of the patient and ordering and/or  instructing patient on testing and care options.     Lucretia Kern, DO

## 2021-02-02 ENCOUNTER — Other Ambulatory Visit: Payer: Self-pay | Admitting: Physician Assistant

## 2021-02-04 ENCOUNTER — Other Ambulatory Visit: Payer: Self-pay | Admitting: Physician Assistant

## 2021-02-04 DIAGNOSIS — I10 Essential (primary) hypertension: Secondary | ICD-10-CM

## 2021-02-10 DIAGNOSIS — G43019 Migraine without aura, intractable, without status migrainosus: Secondary | ICD-10-CM | POA: Diagnosis not present

## 2021-02-10 DIAGNOSIS — G43719 Chronic migraine without aura, intractable, without status migrainosus: Secondary | ICD-10-CM | POA: Diagnosis not present

## 2021-03-03 ENCOUNTER — Other Ambulatory Visit: Payer: Self-pay | Admitting: Physician Assistant

## 2021-03-03 DIAGNOSIS — I10 Essential (primary) hypertension: Secondary | ICD-10-CM

## 2021-03-05 DIAGNOSIS — Z3042 Encounter for surveillance of injectable contraceptive: Secondary | ICD-10-CM | POA: Diagnosis not present

## 2021-03-16 ENCOUNTER — Telehealth: Payer: Self-pay

## 2021-03-16 ENCOUNTER — Other Ambulatory Visit: Payer: Self-pay | Admitting: Family Medicine

## 2021-03-16 ENCOUNTER — Other Ambulatory Visit: Payer: Self-pay | Admitting: Physician Assistant

## 2021-03-16 MED ORDER — CEFUROXIME AXETIL 250 MG PO TABS: 250.0000 mg | ORAL_TABLET | Freq: Two times a day (BID) | ORAL | 0 refills | Status: AC

## 2021-03-16 NOTE — Telephone Encounter (Signed)
Please see message. °

## 2021-03-16 NOTE — Telephone Encounter (Signed)
I've sent Ceftin in.  If she feels like this does not improve her symptoms, she will need to be seen for office visit for any further medications or advice.

## 2021-03-16 NOTE — Telephone Encounter (Signed)
Pt is requesting a refill of CEFTIN. She states she needs it for another sinus infection and that [name] knows she gets these

## 2021-03-17 NOTE — Telephone Encounter (Signed)
Spoke to pt told her per Darnell Level sent Ceftin in. If she feels like this does not improve her symptoms, she will need to be seen for office visit for any further medications or advice. Pt verbalized understanding.

## 2021-04-15 ENCOUNTER — Other Ambulatory Visit (HOSPITAL_COMMUNITY): Payer: Self-pay

## 2021-04-15 ENCOUNTER — Telehealth (INDEPENDENT_AMBULATORY_CARE_PROVIDER_SITE_OTHER): Payer: Federal, State, Local not specified - PPO | Admitting: Family Medicine

## 2021-04-15 DIAGNOSIS — I10 Essential (primary) hypertension: Secondary | ICD-10-CM | POA: Diagnosis not present

## 2021-04-15 DIAGNOSIS — U071 COVID-19: Secondary | ICD-10-CM

## 2021-04-15 MED ORDER — BENZONATATE 100 MG PO CAPS
100.0000 mg | ORAL_CAPSULE | Freq: Two times a day (BID) | ORAL | 0 refills | Status: DC | PRN
  Filled 2021-04-15: qty 20, 10d supply, fill #0

## 2021-04-15 MED ORDER — MOLNUPIRAVIR EUA 200MG CAPSULE
4.0000 | ORAL_CAPSULE | Freq: Two times a day (BID) | ORAL | 0 refills | Status: AC
  Filled 2021-04-15: qty 40, 5d supply, fill #0

## 2021-04-15 MED ORDER — HYDROCHLOROTHIAZIDE 12.5 MG PO TABS: 12.5000 mg | ORAL_TABLET | Freq: Every day | ORAL | 0 refills | Status: DC

## 2021-04-15 NOTE — Progress Notes (Signed)
Virtual Visit via Video Note  I connected with Zyanya  on 04/15/21 at 12:00 PM EDT by a video enabled telemedicine application and verified that I am speaking with the correct person using two identifiers.  Location patient: home, [place] Location provider:work or home office Persons participating in the virtual visit: patient, provider  I discussed the limitations of evaluation and management by telemedicine and the availability of in person appointments. The patient expressed understanding and agreed to proceed.   HPI:  Acute telemedicine visit for COVID19: -Onset: last night, had positive test today -Symptoms include: nasal congestion, mild cough -Denies:fever, NVD, CP, SOB, inability to eat/drink/get out of bed -Pertinent past medical history: hypertension -Pertinent medication allergies: Allergies  Allergen Reactions  . Codeine Anaphylaxis  -COVID-19 vaccine status: fully vaccinated and had booster -requesting refill on hctz, reports BP has been doing good - reports stable -denies any chance of pregnancy  ROS: See pertinent positives and negatives per HPI.  Past Medical History:  Diagnosis Date  . Environmental allergies   . Migraine headache    [facility] -- Dr. Domingo [facility]ing, has been going since 2004    Past Surgical History:  Procedure Laterality Date  . TONSILLECTOMY       Current Outpatient Medications:  .  benzonatate (TESSALON) 100 MG capsule, Take 1 capsule (100 mg total) by mouth 2 (two) times daily as needed for cough., Disp: 20 capsule, Rfl: 0 .  molnupiravir EUA 200 mg CAPS, Take 4 capsules (800 mg total) by mouth 2 (two) times daily for 5 days., Disp: 40 capsule, Rfl: 0 .  aspirin-acetaminophen-caffeine (EXCEDRIN MIGRAINE) 250-250-65 MG tablet, Take 2 tablets by mouth every 6 (six) hours as needed for headache., Disp: , Rfl:  .  cyclobenzaprine (FLEXERIL) 10 MG tablet, Take 10 mg by mouth 3 (three) times daily as needed for muscle spasms., Disp: , Rfl:   .  ferrous sulfate 325 (65 FE) MG tablet, Take 1 tablet (325 mg total) by mouth daily., Disp: 30 tablet, Rfl: 0 .  hydrochlorothiazide (HYDRODIURIL) 12.5 MG tablet, Take 1 tablet (12.5 mg total) by mouth daily. For blood pressure., Disp: 30 tablet, Rfl: 0 .  medroxyPROGESTERone (DEPO-PROVERA) 150 MG/ML injection, Inject 150 mg into the muscle. Every 8-10 weeks. , Disp: , Rfl:  .  pantoprazole (PROTONIX) 20 MG tablet, Take 1 tablet (20 mg total) by mouth daily., Disp: 90 tablet, Rfl: 1 .  rizatriptan (MAXALT) 10 MG tablet, Take 10 mg by mouth as needed for migraine. May repeat in 2 hours if needed, Disp: , Rfl:   EXAM:  VITALS per patient if applicable:  GENERAL: alert, oriented, appears well and in no acute distress  HEENT: atraumatic, conjunttiva clear, no obvious abnormalities on inspection of external nose and ears  NECK: normal movements of the head and neck  LUNGS: on inspection no signs of respiratory distress, breathing rate appears normal, no obvious gross SOB, gasping or wheezing  CV: no obvious cyanosis  MS: moves all visible extremities without noticeable abnormality  PSYCH/NEURO: pleasant and cooperative, no obvious depression or anxiety, speech and thought processing grossly intact  ASSESSMENT AND PLAN:  Discussed the following assessment and plan:  COVID-19  Essential hypertension - Plan: hydrochlorothiazide (HYDRODIURIL) 12.5 MG tablet  -we discussed possible serious and likely etiologies, options for evaluation and workup, limitations of telemedicine visit vs in person visit, treatment, treatment risks and precautions. Pt prefers to treat via telemedicine empirically rather than in person at this moment.  Discussed treatment options, ideal  treatment window, potential complications, isolation and precautions for COVID-19.   The patient opted for treatment with Molnupiravir due to being higher risk for complications of covid or severe disease after lengthy discussion  options/alt/risks. She is aware of the risks for women of childbearing age in terms of mutagenic issues. Discussed EUA status limited knowledge of risks/interactions/side effects per EUA document vs possible benefits and precautions share with patient and provided in patient instructions. Also, advised that patient discuss risks/interactions and use with pharmacist/treatment team as well. Tessalon Rx sent.  Other symptomatic care measures summarized in patient instructions. She also reports is needing refill on Hypertension which she reports is stable. Did advise she is due for labs, however is not able to go to PCP office given currently has covid. Advise follow up once over covid and refill sent in interim.  Work/School slipped offered:  declined Scheduled follow up with PCP offered: agrees to follow up as needed, also sent message to schedulers for regular follow up for HTN. Advised to seek prompt in person care if worsening, new symptoms arise, or if is not improving with treatment. Discussed options for inperson care if PCP office not available. Did let this patient know that I only do telemedicine on Tuesdays and Thursdays for [place]. Advised to schedule follow up visit with PCP or [facility] if any further questions or concerns to avoid delays in care.   I discussed the assessment and treatment plan with the patient. The patient was provided an opportunity to ask questions and all were answered. The patient agreed with the plan and demonstrated an understanding of the instructions.     Lucretia Kern, DO

## 2021-04-15 NOTE — Patient Instructions (Addendum)
HOME CARE TIPS:   -I sent the medication(s) we discussed to your pharmacy: Meds ordered this encounter  Medications  . molnupiravir EUA 200 mg CAPS    Sig: Take 4 capsules (800 mg total) by mouth 2 (two) times daily for 5 days.    Dispense:  40 capsule    Refill:  0  . benzonatate (TESSALON) 100 MG capsule    Sig: Take 1 capsule (100 mg total) by mouth 2 (two) times daily as needed for cough.    Dispense:  20 capsule    Refill:  0  . hydrochlorothiazide (HYDRODIURIL) 12.5 MG tablet    Sig: Take 1 tablet (12.5 mg total) by mouth daily. For blood pressure.    Dispense:  30 tablet    Refill:  0    Needs appt     -I sent in the [place] treatment or referral you requested per our discussion. Please see the information provided below and discuss further with the pharmacist/treatment team.  What is the most important information I should know about LAGEVRIO? LAGEVRIO may cause serious side effects, including: . LAGEVRIO may cause harm to your unborn baby. It is not known if LAGEVRIO will harm your baby if you take LAGEVRIO during pregnancy. o LAGEVRIO is not recommended for use in pregnancy. o LAGEVRIO has not been studied in pregnancy. LAGEVRIO was studied in pregnant animals only. When LAGEVRIO was given to pregnant animals, LAGEVRIO caused harm to their unborn babies.  For individuals who are able to become pregnant: . You should use a reliable method of birth control (contraception) consistently and correctly during treatment with LAGEVRIO and for 4 days after the last dose of LAGEVRIO. Talk to your healthcare provider about reliable birth control methods.  -can use nasal saline a few times per day if you have nasal congestion; sometimes  a short course of Afrin nasal spray for 3 days can help with symptoms as well  -stay hydrated, drink plenty of fluids and eat small healthy meals - avoid dairy  -can take 1000 IU (39mg) Vit D3 and 100-500 mg of Vit C daily per  instructions  -If the Covid test is positive, check out the [facility] for more information on home care, transmission and treatment for COVID19  -follow up with your doctor in 2-3 days unless improving and feeling better  -stay home while sick, except to seek medical care. If you have COVID19, ideally it would be best to stay home for a full 10 days since the onset of symptoms PLUS one day of no fever and feeling better. Wear a good mask that fits snugly (such as N95 or KN95) if around others to reduce the risk of transmission.  It was nice to meet you today, and I really hope you are feeling better soon. I help [place] out with telemedicine visits on Tuesdays and Thursdays and am available for visits on those days. If you have any concerns or questions following this visit please schedule a follow up visit with your Primary Care doctor or seek care at a local urgent care clinic to avoid delays in care.    Seek in person care or schedule a follow up video visit promptly if your symptoms worsen, new concerns arise or you are not improving with treatment. Call 911 and/or seek emergency care if your symptoms are severe or life threatening.     Fact Sheet for Patients And Caregivers Emergency Use Authorization (EUA) Of LAGEVRIOT (molnupiravir) capsules For Coronavirus Disease 2019 (  COVID-19)  What is the most important information I should know about LAGEVRIO? LAGEVRIO may cause serious side effects, including: . LAGEVRIO may cause harm to your unborn baby. It is not known if LAGEVRIO will harm your baby if you take LAGEVRIO during pregnancy. o LAGEVRIO is not recommended for use in pregnancy. o LAGEVRIO has not been studied in pregnancy. LAGEVRIO was studied in pregnant animals only. When LAGEVRIO was given to pregnant animals, LAGEVRIO caused harm to their unborn babies. o You and your healthcare provider may decide that you should take LAGEVRIO during pregnancy if there are no other  COVID-19 treatment options approved or authorized by the FDA that are accessible or clinically appropriate for you. o If you and your healthcare provider decide that you should take LAGEVRIO during pregnancy, you and your healthcare provider should discuss the known and potential benefits and the potential risks of taking LAGEVRIO during pregnancy. For individuals who are able to become pregnant: . You should use a reliable method of birth control (contraception) consistently and correctly during treatment with LAGEVRIO and for 4 days after the last dose of LAGEVRIO. Talk to your healthcare provider about reliable birth control methods. . Before starting treatment with [facility] your healthcare provider may do a pregnancy test to see if you are pregnant before starting treatment with LAGEVRIO. . Tell your healthcare provider right away if you become pregnant or think you may be pregnant during treatment with LAGEVRIO. Pregnancy Surveillance Program: . There is a pregnancy surveillance program for individuals who take LAGEVRIO during pregnancy. The purpose of this program is to collect information about the health of you and your baby. Talk to your healthcare provider about how to take part in this program. . If you take LAGEVRIO during pregnancy and you agree to participate in the pregnancy surveillance program and allow your healthcare provider to share your information with [place], then your healthcare provider will report your use of [place] during pregnancy to [place]. by calling [phone] or [email or web address]. For individuals who are sexually active with partners who are able to become pregnant: . It is not known if LAGEVRIO can affect sperm. While the risk is regarded as low, animal studies to fully assess the potential for LAGEVRIO to affect the babies of males treated with LAGEVRIO have not been completed. A reliable method of birth control  (contraception) should be used consistently and correctly during treatment with LAGEVRIO and for at least 3 months after the last dose. The risk to sperm beyond 3 months is not known. Studies to understand the risk to sperm beyond 3 months are ongoing. Talk to your healthcare provider about reliable birth control methods. Talk to your healthcare provider if you have questions or concerns about how LAGEVRIO may affect sperm. You are being given this fact sheet because your healthcare provider believes it is necessary to provide you with LAGEVRIO for the treatment of adults with mild-to-moderate coronavirus disease 2019 (COVID-19) with positive results of direct SARS-CoV-2 viral testing, and who are at high risk for progression to severe COVID-19 including hospitalization or death, and for whom other COVID-19 treatment options approved or authorized by the FDA are not accessible or clinically appropriate. The U.S. Food and Drug Administration (FDA) has issued an Emergency Use Authorization (EUA) to make LAGEVRIO available during the COVID-19 pandemic (for more details about an EUA please see "What is an Emergency Use Authorization?" at the end of this document). LAGEVRIO is not an FDA-approved  medicine in the [place]. Read this Fact Sheet for information about LAGEVRIO. Talk to your healthcare provider about your options if you have any questions. It is your choice to take LAGEVRIO.  What is COVID-19? COVID-19 is caused by a virus called a coronavirus. You can get COVID-19 through close contact with another person who has the virus. COVID-19 illnesses have ranged from very mild-to-severe, including illness resulting in death. While information so far suggests that most COVID-19 illness is mild, serious illness can happen and may cause some of your other medical conditions to become worse. Older people and people of all ages with severe, long lasting (chronic) medical conditions like  heart disease, lung disease and diabetes, for example seem to be at higher risk of being hospitalized for COVID-19.  What is LAGEVRIO? LAGEVRIO is an investigational medicine used to treat mild-to-moderate COVID-19 in adults: . with positive results of direct SARS-CoV-2 viral testing, and . who are at high risk for progression to severe COVID-19 including hospitalization or death, and for whom other COVID-19 treatment options approved or authorized by the FDA are not accessible or clinically appropriate. The FDA has authorized the emergency use of LAGEVRIO for the treatment of mild-tomoderate COVID-19 in adults under an EUA. For more information on EUA, see the "What is an Emergency Use Authorization (EUA)?" section at the end of this Fact Sheet. LAGEVRIO is not authorized: . for use in people less than 66 years of age. . for prevention of COVID-19. . for people needing hospitalization for COVID-19. . for use for longer than 5 consecutive days.  What should I tell my healthcare provider before I take LAGEVRIO? Tell your healthcare provider if you: . Have any allergies . Are breastfeeding or plan to breastfeed . Have any serious illnesses . Are taking any medicines (prescription, over-the-counter, vitamins, or herbal products).  How do I take LAGEVRIO? [name] Take LAGEVRIO exactly as your healthcare provider tells you to take it. . Take 4 capsules of LAGEVRIO every 12 hours (for example, at 8 am and at 8 pm) . Take LAGEVRIO for 5 days. It is important that you complete the full 5 days of treatment with LAGEVRIO. Do not stop taking LAGEVRIO before you complete the full 5 days of treatment, even if you feel better. . Take LAGEVRIO with or without food. . You should stay in isolation for as long as your healthcare provider tells you to. Talk to your healthcare provider if you are not sure about how to properly isolate while you have COVID-19. [name] Swallow LAGEVRIO capsules whole. Do not open,  break, or crush the capsules. If you cannot swallow capsules whole, tell your healthcare provider. . What to do if you miss a dose: o If it has been less than 10 hours since the missed dose, take it as soon as you remember o If it has been more than 10 hours since the missed dose, skip the missed dose and take your dose at the next scheduled time. . Do not double the dose of LAGEVRIO to make up for a missed dose.  What are the important possible side effects of LAGEVRIO? . See, "What is the most important information I should know about LAGEVRIO?" . Allergic Reactions. Allergic reactions can happen in people taking LAGEVRIO, even after only 1 dose. Stop taking LAGEVRIO and call your healthcare provider right away if you get any of the following symptoms of an allergic reaction: o hives o rapid heartbeat o trouble swallowing or breathing  o swelling of the mouth, lips, or face o throat tightness o hoarseness o skin rash The most common side effects of LAGEVRIO are: . diarrhea . nausea . dizziness These are not all the possible side effects of LAGEVRIO. Not many people have taken LAGEVRIO. Serious and unexpected side effects may happen. This medicine is still being studied, so it is possible that all of the risks are not known at this time.  What other treatment choices are there?  Veklury (remdesivir) is FDA-approved as an intravenous (IV) infusion for the treatment of mildto-moderate WUXLK-44 in certain adults and children. Talk with your doctor to see if Marijean Heath is appropriate for you. Like LAGEVRIO, FDA may also allow for the emergency use of other medicines to treat people with COVID-19. Go to LacrosseProperties.si for more information. It is your choice to be treated or not to be treated with LAGEVRIO. Should you decide not to take it, it will not change your standard medical  care.  What if I am breastfeeding? Breastfeeding is not recommended during treatment with LAGEVRIO and for 4 days after the last dose of LAGEVRIO. If you are breastfeeding or plan to breastfeed, talk to your healthcare provider about your options and specific situation before taking LAGEVRIO.  How do I report side effects with LAGEVRIO? Contact your healthcare provider if you have any side effects that bother you or do not go away. Report side effects to FDA MedWatch at SmoothHits.hu or call 1-800-FDA-1088 (1- 539-523-7334).  How should I store [place]? [name] Store LAGEVRIO capsules at room temperature between 31F to 48F (20C to 25C). [name] Keep LAGEVRIO and all medicines out of the reach of children and pets. How can I learn more about COVID-19? [name] Ask your healthcare provider. . Visit SeekRooms.co.uk . Contact your local or state public health department. . Call [place] at [phone] (toll free in the [place] . Visit [email or web address].com  What Is an Emergency Use Authorization (EUA)? The [place] FDA has made [place] available under an emergency access mechanism called an Emergency Use Authorization (EUA) The EUA is supported by a Presenter, broadcasting Health and Human Service (HHS) declaration that circumstances exist to justify emergency use of drugs and biological products during the COVID-19 pandemic. LAGEVRIO for the treatment of mild-to-moderate COVID-19 in adults with positive results of direct SARS-CoV-2 viral testing, who are at high risk for progression to severe COVID-19, including hospitalization or death, and for whom alternative COVID-19 treatment options approved or authorized by FDA are not accessible or clinically appropriate, has not undergone the same type of review as an FDA-approved product. In issuing an EUA under the QIHKV-42 public health emergency, the FDA has determined, among other things, that based on the total amount of scientific  evidence available including data from adequate and well-controlled clinical trials, if available, it is reasonable to believe that the product may be effective for diagnosing, treating, or preventing COVID-19, or a serious or life-threatening disease or condition caused by COVID-19; that the known and potential benefits of the product, when used to diagnose, treat, or prevent such disease or condition, outweigh the known and potential risks of such product; and that there are no adequate, approved, and available alternatives.  All of these criteria must be met to allow for the product to be used in the treatment of patients during the COVID-19 pandemic. The EUA for LAGEVRIO is in effect for the duration of the COVID-19 declaration justifying emergency use of LAGEVRIO, unless terminated or revoked (after  which LAGEVRIO may no longer be used under the EUA). For patent information: http://rogers.info/ Copyright  2021-2022 [place] [place], [place] and its affiliates. All rights reserved. [id number] Revised: March 2022

## 2021-04-19 ENCOUNTER — Other Ambulatory Visit: Payer: Self-pay | Admitting: Family Medicine

## 2021-04-19 DIAGNOSIS — I10 Essential (primary) hypertension: Secondary | ICD-10-CM

## 2021-04-19 NOTE — Progress Notes (Signed)
Called patient and she said was unable to schedule at the moment but will call back with her work schedule.

## 2021-04-30 DIAGNOSIS — Z3042 Encounter for surveillance of injectable contraceptive: Secondary | ICD-10-CM | POA: Diagnosis not present

## 2021-05-11 ENCOUNTER — Other Ambulatory Visit: Payer: Self-pay | Admitting: Family Medicine

## 2021-05-11 DIAGNOSIS — I10 Essential (primary) hypertension: Secondary | ICD-10-CM

## 2021-05-11 NOTE — Telephone Encounter (Signed)
  LAST APPOINTMENT DATE: 04/19/2021   NEXT APPOINTMENT DATE:@Visit  date not found  MEDICATION:hydrochlorothiazide (HYDRODIURIL) 12.5 MG tablet  PHARMACY:[id number] [address] [facility] [address]   Please advise

## 2021-05-18 ENCOUNTER — Other Ambulatory Visit: Payer: Self-pay

## 2021-05-18 ENCOUNTER — Other Ambulatory Visit (INDEPENDENT_AMBULATORY_CARE_PROVIDER_SITE_OTHER): Payer: Federal, State, Local not specified - PPO

## 2021-05-18 DIAGNOSIS — Z124 Encounter for screening for malignant neoplasm of cervix: Secondary | ICD-10-CM | POA: Diagnosis not present

## 2021-05-18 DIAGNOSIS — Z01411 Encounter for gynecological examination (general) (routine) with abnormal findings: Secondary | ICD-10-CM | POA: Diagnosis not present

## 2021-05-18 DIAGNOSIS — Z113 Encounter for screening for infections with a predominantly sexual mode of transmission: Secondary | ICD-10-CM | POA: Diagnosis not present

## 2021-05-18 DIAGNOSIS — Z3042 Encounter for surveillance of injectable contraceptive: Secondary | ICD-10-CM | POA: Diagnosis not present

## 2021-05-18 DIAGNOSIS — Z6824 Body mass index (BMI) 24.0-24.9, adult: Secondary | ICD-10-CM | POA: Diagnosis not present

## 2021-05-18 DIAGNOSIS — I10 Essential (primary) hypertension: Secondary | ICD-10-CM | POA: Diagnosis not present

## 2021-05-18 DIAGNOSIS — Z01419 Encounter for gynecological examination (general) (routine) without abnormal findings: Secondary | ICD-10-CM | POA: Diagnosis not present

## 2021-05-18 LAB — CBC WITH DIFFERENTIAL/PLATELET
Absolute Monocytes: 402 cells/uL (ref 200–950)
Basophils Absolute: 49 cells/uL (ref 0–200)
Basophils Relative: 0.6 %
Eosinophils Absolute: 541 cells/uL — ABNORMAL HIGH (ref 15–500)
Eosinophils Relative: 6.6 %
HCT: 43 % (ref 35.0–45.0)
Hemoglobin: 14.8 g/dL (ref 11.7–15.5)
Lymphs Abs: 3329 cells/uL (ref 850–3900)
MCH: 31.1 pg (ref 27.0–33.0)
MCHC: 34.4 g/dL (ref 32.0–36.0)
MCV: 90.3 fL (ref 80.0–100.0)
MPV: 10.4 fL (ref 7.5–12.5)
Monocytes Relative: 4.9 %
Neutro Abs: 3879 cells/uL (ref 1500–7800)
Neutrophils Relative %: 47.3 %
Platelets: 221 10*3/uL (ref 140–400)
RBC: 4.76 10*6/uL (ref 3.80–5.10)
RDW: 12.4 % (ref 11.0–15.0)
Total Lymphocyte: 40.6 %
WBC: 8.2 10*3/uL (ref 3.8–10.8)

## 2021-05-18 MED ORDER — HYDROCHLOROTHIAZIDE 12.5 MG PO TABS: 12.5000 mg | ORAL_TABLET | Freq: Every day | ORAL | 0 refills | Status: DC

## 2021-05-19 NOTE — Progress Notes (Signed)
Please inform patient of the following:  Blood work is all stable.  Algis Greenhouse. Jerline Pain, MD 05/19/2021 9:21 AM

## 2021-06-09 DIAGNOSIS — Z1231 Encounter for screening mammogram for malignant neoplasm of breast: Secondary | ICD-10-CM | POA: Diagnosis not present

## 2021-06-09 LAB — HM MAMMOGRAPHY

## 2021-06-09 LAB — COLOGUARD: COLOGUARD: NEGATIVE

## 2021-06-10 ENCOUNTER — Other Ambulatory Visit: Payer: Self-pay | Admitting: Physician Assistant

## 2021-06-10 DIAGNOSIS — I10 Essential (primary) hypertension: Secondary | ICD-10-CM

## 2021-06-14 ENCOUNTER — Encounter: Payer: Self-pay | Admitting: Physician Assistant

## 2021-06-15 ENCOUNTER — Other Ambulatory Visit: Payer: Self-pay

## 2021-06-15 ENCOUNTER — Encounter: Payer: Self-pay | Admitting: Family Medicine

## 2021-06-15 ENCOUNTER — Ambulatory Visit (INDEPENDENT_AMBULATORY_CARE_PROVIDER_SITE_OTHER): Payer: Federal, State, Local not specified - PPO | Admitting: Family Medicine

## 2021-06-15 DIAGNOSIS — I10 Essential (primary) hypertension: Secondary | ICD-10-CM

## 2021-06-15 DIAGNOSIS — K219 Gastro-esophageal reflux disease without esophagitis: Secondary | ICD-10-CM | POA: Diagnosis not present

## 2021-06-15 MED ORDER — PANTOPRAZOLE SODIUM 20 MG PO TBEC: 20.0000 mg | DELAYED_RELEASE_TABLET | Freq: Every day | ORAL | 1 refills | Status: DC

## 2021-06-15 MED ORDER — HYDROCHLOROTHIAZIDE 12.5 MG PO TABS: 12.5000 mg | ORAL_TABLET | Freq: Every day | ORAL | 1 refills | Status: DC

## 2021-06-15 NOTE — Assessment & Plan Note (Signed)
Doing well on HCTZ alone.  We will continue this.  She will continue working on lifestyle modifications.  Follow-up with her PCP in 6 to 12 months.

## 2021-06-15 NOTE — Progress Notes (Signed)
    Catherine Cox is a 45 y.o. female who presents today for an office visit.  Assessment/Plan:  Chronic Problems Addressed Today: Essential hypertension Doing well on HCTZ alone.  We will continue this.  She will continue working on lifestyle modifications.  Follow-up with her PCP in 6 to 12 months.  GERD (gastroesophageal reflux disease) Stable on Protonix 20 mg daily.  Refill sent in today.    Subjective:  HPI:  She is doing reasonably well. She was taken off her blood pressure medication and was told to only take her water pill. Tolerating hydrochlorothiazide without adverse side effects. She needs refill on hydrochlorothiazide and pantoprazole. No fever, nausea, vomiting, diarrhea, or shortness of breath.  BP Readings from Last 3 Encounters:  06/15/21 122/81  10/23/20 (!) 160/100  06/01/20 116/84   She has been maintaining her weight through her diet.  Wt Readings from Last 3 Encounters:  06/15/21 159 lb 9.6 oz (72.4 kg)  12/19/20 156 lb (70.8 kg)  10/23/20 157 lb 8 oz (71.4 kg)     Planning for a big move soon!       Objective:  Physical Exam: BP 122/81   Pulse 85   Temp 98.1 F (36.7 C) (Temporal)   Ht 5' 8"  (1.727 m)   Wt 159 lb 9.6 oz (72.4 kg)   SpO2 99%   BMI 24.27 kg/m   Gen: No acute distress, resting comfortably CV: Regular rate and rhythm with no murmurs appreciated Pulm: Normal work of breathing, clear to auscultation bilaterally with no crackles, wheezes, or rhonchi Neuro: Grossly normal, moves all extremities Psych: Normal affect and thought content     I,Catherine Cox,acting as a scribe for Catherine Chyle, MD.,have documented all relevant documentation on the behalf of Catherine Chyle, MD,as directed by  Catherine Chyle, MD while in the presence of Catherine Chyle, MD.   I, Catherine Chyle, MD, have reviewed all documentation for this visit. The documentation on 06/15/21 for the exam, diagnosis, procedures, and orders are all accurate and complete.   Algis Greenhouse. Jerline Pain, MD 06/15/2021 2:00 PM

## 2021-06-15 NOTE — Patient Instructions (Signed)
It was very nice to see you today!  I will refill your medications.  Please come back in 6 months or for a visit with [facility].  Please let us know if you need to be seen sooner.  Take care, Dr Jerline Pain  PLEASE NOTE:  If you had any lab tests please let us know if you have not heard back within a few days. You may see your results on mychart before we have a chance to review them but we will give you a call once they are reviewed by [place]. If we ordered any referrals today, please let us know if you have not heard from their office within the next week.   Please try these tips to maintain a healthy lifestyle:  Eat at least 3 REAL meals and 1-2 snacks per day.  Aim for no more than 5 hours between eating.  If you eat breakfast, please do so within one hour of getting up.   Each meal should contain half fruits/vegetables, one quarter protein, and one quarter carbs (no bigger than a computer mouse)  Cut down on sweet beverages. This includes juice, soda, and sweet tea.   Drink at least 1 glass of water with each meal and aim for at least 8 glasses per day  Exercise at least 150 minutes every week.

## 2021-06-15 NOTE — Assessment & Plan Note (Signed)
Stable on Protonix 20 mg daily.  Refill sent in today.

## 2021-06-25 DIAGNOSIS — Z3042 Encounter for surveillance of injectable contraceptive: Secondary | ICD-10-CM | POA: Diagnosis not present

## 2021-10-01 ENCOUNTER — Telehealth: Payer: Self-pay | Admitting: Family Medicine

## 2021-10-01 ENCOUNTER — Encounter: Payer: Self-pay | Admitting: Family Medicine

## 2021-10-01 ENCOUNTER — Telehealth (INDEPENDENT_AMBULATORY_CARE_PROVIDER_SITE_OTHER): Payer: Self-pay | Admitting: Family Medicine

## 2021-10-01 VITALS — Ht 68.0 in | Wt 163.0 lb

## 2021-10-01 DIAGNOSIS — J329 Chronic sinusitis, unspecified: Secondary | ICD-10-CM

## 2021-10-01 DIAGNOSIS — J309 Allergic rhinitis, unspecified: Secondary | ICD-10-CM

## 2021-10-01 MED ORDER — AMOXICILLIN-POT CLAVULANATE 500-125 MG PO TABS: 2.0000 | ORAL_TABLET | Freq: Two times a day (BID) | ORAL | 0 refills | Status: AC

## 2021-10-01 NOTE — Progress Notes (Signed)
   Catherine Cox is a 45 y.o. female who presents today for a virtual office visit.  Assessment/Plan:  New/Acute Problems: Sinusitis Start high-dose Augmentin.  States a low-dose has not worked for her in the past.  Also start Astelin.  Encourage good oral hydration.  Discussed reasons to return to care.  Chronic Problems Addressed Today: Allergic rhinitis Flared up recently.  Likely contributing to her sinus infection.  We will be starting Astelin which should help with this.  She can continue over-the-counter Allegra as well.    Subjective:  HPI:  Symptoms started about a week ago. Has had worsening headaches that are different than her normal migraines. She has tried Human resources officer D which helped a little.  Her allergies have been worse for the last several weeks.  Symptoms are getting worse. Covid test is negative. No fevers or chills. Consistent with previous sinus infections.       Objective/Observations  Physical Exam: Gen: NAD, resting comfortably Pulm: Normal work of breathing Neuro: Grossly normal, moves all extremities Psych: Normal affect and thought content  Virtual Visit via Video   I connected with Catherine Cox on 10/01/21 at 10:00 AM EDT by a video enabled telemedicine application and verified that I am speaking with the correct person using two identifiers. The limitations of evaluation and management by telemedicine and the availability of in person appointments were discussed. The patient expressed understanding and agreed to proceed.   Patient location: Home Provider location: [place] participating in the virtual visit: Myself and Patient     Algis Greenhouse. Jerline Pain, MD 10/01/2021 10:21 AM

## 2021-10-01 NOTE — Progress Notes (Incomplete)
   Catherine Cox is a 45 y.o. female who presents today for a virtual office visit.  Assessment/Plan:  New/Acute Problems: ***  Chronic Problems Addressed Today: No problem-specific Assessment & Plan notes found for this encounter.     Subjective:  HPI:  ***        Objective/Observations  Physical Exam: Gen: NAD, resting comfortably*** Pulm: Normal work of breathing Neuro: Grossly normal, moves all extremities Psych: Normal affect and thought content  Virtual Visit via Video   I connected with Catherine Cox on @TODAY @ at  4:00 PM EDT by a video enabled telemedicine application and verified that I am speaking with the correct person using two identifiers. The limitations of evaluation and management by telemedicine and the availability of in person appointments were discussed. The patient expressed understanding and agreed to proceed.   Patient location: Home Provider location: [place] participating in the virtual visit: Myself and ***Patient     I,[place] Tourist information centre manager as a Education administrator for Dimas Chyle, MD.,have documented all relevant documentation on the behalf of Dimas Chyle, MD,as directed by  Dimas Chyle, MD while in the presence of Dimas Chyle, MD. *** Algis Greenhouse. Jerline Pain, MD 10/01/2021 8:06 AM

## 2021-12-07 ENCOUNTER — Other Ambulatory Visit: Payer: Self-pay | Admitting: Family Medicine

## 2021-12-07 DIAGNOSIS — I10 Essential (primary) hypertension: Secondary | ICD-10-CM

## 2021-12-23 ENCOUNTER — Other Ambulatory Visit: Payer: Self-pay

## 2021-12-23 ENCOUNTER — Telehealth (INDEPENDENT_AMBULATORY_CARE_PROVIDER_SITE_OTHER): Payer: Self-pay | Admitting: Physician Assistant

## 2021-12-23 ENCOUNTER — Encounter: Payer: Self-pay | Admitting: Physician Assistant

## 2021-12-23 VITALS — Ht 68.0 in | Wt 160.0 lb

## 2021-12-23 DIAGNOSIS — R0981 Nasal congestion: Secondary | ICD-10-CM

## 2021-12-23 DIAGNOSIS — I10 Essential (primary) hypertension: Secondary | ICD-10-CM

## 2021-12-23 MED ORDER — HYDROCHLOROTHIAZIDE 12.5 MG PO TABS: 12.5000 mg | ORAL_TABLET | Freq: Every day | ORAL | 1 refills | Status: DC

## 2021-12-23 MED ORDER — AMOXICILLIN-POT CLAVULANATE 875-125 MG PO TABS: 1.0000 | ORAL_TABLET | Freq: Two times a day (BID) | ORAL | 0 refills | Status: DC

## 2021-12-24 NOTE — Progress Notes (Signed)
Virtual Visit via Video Note   I, [name], connected with  Catherine Cox  ([id number], Aug 19, 1976) on 12/24/21 at 11:30 AM EST by a video-enabled telemedicine application and verified that I am speaking with the correct person using two identifiers.  Location: Patient: Home Provider: [place] office   I discussed the limitations of evaluation and management by telemedicine and the availability of in person appointments. The patient expressed understanding and agreed to proceed.    History of Present Illness: Catherine Cox is a 46 y.o. who identifies as a female who was assigned female at birth, and is being seen today for sinusitis and HTN.  Sinusitis Patient reports that she has one week of sinus congestion, nasal discharge and ear fullness.  She has had some bloody discharge from her nose, states that is nothing profuse but mixed in with her mucus at times.  She also states that she has tried multiple over-the-counter medications including DayQuil and NyQuil and antihistamines.  Nothing seems to be helping.  She has recurrent sinusitis with similar symptoms in the past.  Denies: Fever, chills, nausea, vomiting, poor appetite.  She took a home COVID test and this was negative.  HTN Currently taking hydrochlorothiazide 12.5 mg. At home blood pressure readings are: Normal per patient. Patient denies chest pain, SOB, blurred vision, dizziness, unusual headaches, lower leg swelling. Patient is compliant with medication. Denies excessive caffeine intake, stimulant usage, excessive alcohol intake, or increase in salt consumption.  BP Readings from Last 3 Encounters:  06/15/21 122/81  10/23/20 (!) 160/100  06/01/20 116/84     Problems:  Patient Active Problem List   Diagnosis Date Noted   GERD (gastroesophageal reflux disease) 06/15/2021   Essential hypertension 10/23/2020   Migraine 11/22/2016    Allergies:  Allergies  Allergen Reactions   Codeine Anaphylaxis    Medications:  Current Outpatient Medications:    amoxicillin-clavulanate (AUGMENTIN) 875-125 MG tablet, Take 1 tablet by mouth 2 (two) times daily., Disp: 20 tablet, Rfl: 0   aspirin-acetaminophen-caffeine (EXCEDRIN MIGRAINE) 250-250-65 MG tablet, Take 2 tablets by mouth every 6 (six) hours as needed for headache., Disp: , Rfl:    cyclobenzaprine (FLEXERIL) 10 MG tablet, Take 10 mg by mouth 3 (three) times daily as needed for muscle spasms., Disp: , Rfl:    medroxyPROGESTERone (DEPO-PROVERA) 150 MG/ML injection, Inject 150 mg into the muscle. Every 8-10 weeks. , Disp: , Rfl:    pantoprazole (PROTONIX) 20 MG tablet, Take 1 tablet (20 mg total) by mouth daily., Disp: 90 tablet, Rfl: 1   rizatriptan (MAXALT) 10 MG tablet, Take 10 mg by mouth as needed for migraine. May repeat in 2 hours if needed, Disp: , Rfl:    benzonatate (TESSALON) 100 MG capsule, Take 1 capsule (100 mg total) by mouth 2 (two) times daily as needed for cough. (Patient not taking: Reported on 12/23/2021), Disp: 20 capsule, Rfl: 0   hydrochlorothiazide (HYDRODIURIL) 12.5 MG tablet, Take 1 tablet (12.5 mg total) by mouth daily. For blood pressure., Disp: 90 tablet, Rfl: 1  Observations/Objective: Patient is well-developed, well-nourished in no acute distress.  Resting comfortably   Head is normocephalic, atraumatic.  No labored breathing.  Speech is clear and coherent with logical content.  Patient is alert and oriented at baseline.    Assessment and Plan: 1. Essential hypertension Normotensive per patient Refill hydrochlorothiazide 12.5 mg daily Continue to monitor blood pressure at home if numbers are consistently greater than 150/90, I recommend that she follow-up  with [place] in the office Follow-up in 3 to 6 months, sooner if concerns  2. Nasal sinus congestion No red flags on discussion Suspect sinusitis, I have sent in Augmentin for her Push fluids and rest If symptoms do not improve as anticipated or she has other  concerns recommend follow-up in the office  Follow Up Instructions: I discussed the assessment and treatment plan with the patient. The patient was provided an opportunity to ask questions and all were answered. The patient agreed with the plan and demonstrated an understanding of the instructions.  A copy of instructions were sent to the patient via MyChart unless otherwise noted below.   The patient was advised to call back or seek an in-person evaluation if the symptoms worsen or if the condition fails to improve as anticipated.  [name], [place]

## 2022-02-06 ENCOUNTER — Other Ambulatory Visit: Payer: Self-pay | Admitting: Physician Assistant

## 2022-05-11 ENCOUNTER — Telehealth (INDEPENDENT_AMBULATORY_CARE_PROVIDER_SITE_OTHER): Payer: Commercial Managed Care - HMO | Admitting: Physician Assistant

## 2022-05-11 ENCOUNTER — Telehealth: Payer: Self-pay | Admitting: Physician Assistant

## 2022-05-11 ENCOUNTER — Encounter: Payer: Self-pay | Admitting: Physician Assistant

## 2022-05-11 VITALS — Ht 68.0 in | Wt 160.0 lb

## 2022-05-11 DIAGNOSIS — I10 Essential (primary) hypertension: Secondary | ICD-10-CM | POA: Diagnosis not present

## 2022-05-11 DIAGNOSIS — K219 Gastro-esophageal reflux disease without esophagitis: Secondary | ICD-10-CM | POA: Diagnosis not present

## 2022-05-11 DIAGNOSIS — R0981 Nasal congestion: Secondary | ICD-10-CM

## 2022-05-11 MED ORDER — AMOXICILLIN-POT CLAVULANATE 875-125 MG PO TABS: 1.0000 | ORAL_TABLET | Freq: Two times a day (BID) | ORAL | 0 refills | Status: DC

## 2022-05-11 NOTE — Addendum Note (Signed)
Addended by: Erlene Quan on: 05/11/2022 04:12 PM   Modules accepted: Orders

## 2022-05-11 NOTE — Telephone Encounter (Signed)
Spoke to pt told her Catherine Cox has sent the Augmentin to [facility]. Pt verbalized understanding.

## 2022-05-11 NOTE — Telephone Encounter (Signed)
Pt called stating the augmentin she was prescribed during 05/11/22 virtual visit was not called it. States she would like for this medication to be sent as soon as possible. Please Advise.

## 2022-05-11 NOTE — Progress Notes (Signed)
I acted as a Education administrator for [employer], PA-C Anselmo Pickler, LPN  Virtual Visit via Video Note   I, Catherine Coke, PA, connected with  Catherine Cox  ([id number], October 02, 1976) on 05/11/22 at  1:20 PM EDT by a video-enabled telemedicine application and verified that I am speaking with the correct person using two identifiers.  Location: Patient: Home Provider: [place] office   I discussed the limitations of evaluation and management by telemedicine and the availability of in person appointments. The patient expressed understanding and agreed to proceed.    History of Present Illness: Catherine Cox is a 46 y.o. who identifies as a female who was assigned female at birth, and is being seen today for sinus problem.   Sinus infection Pt c/o sinus headache and green nasal drainage with blood tinge x 2-3 days. She has taken some Excedrin Migraine no relief. Denies fever or chills, no cough. She has history of sinus infections, with similar presentations.  HTN Currently taking HCTZ 12.5 mg. At home blood pressure readings are: WNL. Patient denies chest pain, SOB, blurred vision, dizziness, unusual headaches, lower leg swelling. Patient is compliant with medication. Denies excessive caffeine intake, stimulant usage, excessive alcohol intake, or increase in salt consumption.  BP Readings from Last 3 Encounters:  06/15/21 122/81  10/23/20 (!) 160/100  06/01/20 116/84   GERD Needs refill on protonix 20 mg daily. Tolerating well. Denies: stool changes, severe abd pain, unintentional weight loss.  Problems:  Patient Active Problem List   Diagnosis Date Noted   GERD (gastroesophageal reflux disease) 06/15/2021   Essential hypertension 10/23/2020   Migraine 11/22/2016    Allergies:  Allergies  Allergen Reactions   Codeine Anaphylaxis   Medications:  Current Outpatient Medications:    aspirin-acetaminophen-caffeine (EXCEDRIN MIGRAINE) 250-250-65 MG tablet, Take 2  tablets by mouth every 6 (six) hours as needed for headache., Disp: , Rfl:    cyclobenzaprine (FLEXERIL) 10 MG tablet, Take 10 mg by mouth 3 (three) times daily as needed for muscle spasms., Disp: , Rfl:    hydrochlorothiazide (HYDRODIURIL) 12.5 MG tablet, Take 1 tablet (12.5 mg total) by mouth daily. For blood pressure., Disp: 90 tablet, Rfl: 1   medroxyPROGESTERone (DEPO-PROVERA) 150 MG/ML injection, Inject 150 mg into the muscle. Every 8-10 weeks. , Disp: , Rfl:    pantoprazole (PROTONIX) 20 MG tablet, TAKE ONE TABLET BY MOUTH DAILY, Disp: 90 tablet, Rfl: 1   rizatriptan (MAXALT) 10 MG tablet, Take 10 mg by mouth as needed for migraine. May repeat in 2 hours if needed, Disp: , Rfl:   Observations/Objective: Patient is well-developed, well-nourished in no acute distress.  Resting comfortably  at home.  Head is normocephalic, atraumatic.  No labored breathing.  Speech is clear and coherent with logical content.  Patient is alert and oriented at baseline.   Assessment and Plan: 1. Nasal sinus congestion No red flags on discussion.  Will initiate augmentin per orders. Discussed taking medications as prescribed. Reviewed return precautions including worsening fever, SOB, worsening cough or other concerns. Push fluids and rest. I recommend that patient follow-up if symptoms worsen or persist despite treatment x 7-10 days, sooner if needed.  2. Gastroesophageal reflux disease, unspecified whether esophagitis present Well controlled Refill protonix 20 mg daily Follow-up prn  3. Essential hypertension Normotensive per patient Continue HCTZ 12.5 mg daily Continue to monitor BP regularly, if BP consistently > 140/90, recommend f/u appt  Follow Up Instructions: I discussed the assessment and treatment plan with  the patient. The patient was provided an opportunity to ask questions and all were answered. The patient agreed with the plan and demonstrated an understanding of the instructions.  A  copy of instructions were sent to the patient via MyChart unless otherwise noted below.   The patient was advised to call back or seek an in-person evaluation if the symptoms worsen or if the condition fails to improve as anticipated.  Catherine Cox, [place]

## 2022-07-06 LAB — HM MAMMOGRAPHY

## 2022-08-11 ENCOUNTER — Other Ambulatory Visit: Payer: Self-pay | Admitting: Physician Assistant

## 2022-08-19 ENCOUNTER — Encounter: Payer: Self-pay | Admitting: Physician Assistant

## 2022-08-19 ENCOUNTER — Telehealth (INDEPENDENT_AMBULATORY_CARE_PROVIDER_SITE_OTHER): Payer: Commercial Managed Care - HMO | Admitting: Physician Assistant

## 2022-08-19 VITALS — Ht 68.0 in | Wt 164.0 lb

## 2022-08-19 DIAGNOSIS — I10 Essential (primary) hypertension: Secondary | ICD-10-CM | POA: Diagnosis not present

## 2022-08-19 DIAGNOSIS — R0981 Nasal congestion: Secondary | ICD-10-CM

## 2022-08-19 MED ORDER — AMOXICILLIN-POT CLAVULANATE 875-125 MG PO TABS: 1.0000 | ORAL_TABLET | Freq: Two times a day (BID) | ORAL | 0 refills | Status: DC

## 2022-08-19 NOTE — Progress Notes (Signed)
Virtual Visit via Video Note   I, Catherine Cox, connected with  Catherine Cox  ([id number], 1976/01/19) on 08/19/22 at  1:40 PM EDT by a video-enabled telemedicine application and verified that I am speaking with the correct person using two identifiers.  Location: Patient: Home Provider: [place] office   I discussed the limitations of evaluation and management by telemedicine and the availability of in person appointments. The patient expressed understanding and agreed to proceed.    History of Present Illness: Catherine Cox is a 46 y.o. who identifies as a female who was assigned female at birth, and is being seen today for sinus problem. Pt c/o sinus pressure and headache x 3 days, and started today with  green brown nasal drainage. Denies fever or chills. She did 2 COVID test yesterday and today both Negative.  She has history of sinus infections that have presented the same way.  HTN Currently taking hydrochlorothiazide 12.5 mg daily. At home blood pressure readings are: Routinely checked and currently around 120/80. Patient denies chest pain, SOB, blurred vision, dizziness, unusual headaches, lower leg swelling. Patient is compliant with medication. Denies excessive caffeine intake, stimulant usage, excessive alcohol intake, or increase in salt consumption.  BP Readings from Last 3 Encounters:  06/15/21 122/81  10/23/20 (!) 160/100  06/01/20 116/84     Problems:  Patient Active Problem List   Diagnosis Date Noted   GERD (gastroesophageal reflux disease) 06/15/2021   Essential hypertension 10/23/2020   Migraine 11/22/2016    Allergies:  Allergies  Allergen Reactions   Codeine Anaphylaxis   Medications:  Current Outpatient Medications:    amoxicillin-clavulanate (AUGMENTIN) 875-125 MG tablet, Take 1 tablet by mouth 2 (two) times daily., Disp: 14 tablet, Rfl: 0   aspirin-acetaminophen-caffeine (EXCEDRIN MIGRAINE) 250-250-65 MG tablet, Take 2 tablets by  mouth every 6 (six) hours as needed for headache., Disp: , Rfl:    cyclobenzaprine (FLEXERIL) 10 MG tablet, Take 10 mg by mouth 3 (three) times daily as needed for muscle spasms., Disp: , Rfl:    hydrochlorothiazide (HYDRODIURIL) 12.5 MG tablet, Take 1 tablet (12.5 mg total) by mouth daily. For blood pressure., Disp: 90 tablet, Rfl: 1   medroxyPROGESTERone (DEPO-PROVERA) 150 MG/ML injection, Inject 150 mg into the muscle. Every 8-10 weeks. , Disp: , Rfl:    pantoprazole (PROTONIX) 20 MG tablet, TAKE ONE TABLET BY MOUTH DAILY, Disp: 90 tablet, Rfl: 1   rizatriptan (MAXALT) 10 MG tablet, Take 10 mg by mouth as needed for migraine. May repeat in 2 hours if needed, Disp: , Rfl:   Observations/Objective: Patient is well-developed, well-nourished in no acute distress.  Resting comfortably  at home.  Head is normocephalic, atraumatic.  No labored breathing.  Speech is clear and coherent with logical content.  Patient is alert and oriented at baseline.   Assessment and Plan: 1. Nasal sinus congestion Symptoms are consistent with sinusitis We will start oral Augmentin for symptoms Continue supportive care with nasal sprays and ibuprofen/Tylenol as needed Worsening precautions advised  2.  Essential hypertension Currently well controlled She is wondering if she can stop this medication if her blood pressures remain normal I discussed that as long as her blood pressures are controlled at 120/80, she is on a good regimen I discussed that should her systolic drop under 100 or if she develops lightheadedness, dizziness, other concerns, then she probably needs an in office appointment to discuss if she needs a reduced dose or different medication  Follow Up  Instructions: I discussed the assessment and treatment plan with the patient. The patient was provided an opportunity to ask questions and all were answered. The patient agreed with the plan and demonstrated an understanding of the instructions.  A  copy of instructions were sent to the patient via MyChart unless otherwise noted below.   The patient was advised to call back or seek an in-person evaluation if the symptoms worsen or if the condition fails to improve as anticipated.  Catherine Cox, [place]

## 2022-08-25 ENCOUNTER — Encounter: Payer: Self-pay | Admitting: Physician Assistant

## 2022-11-07 ENCOUNTER — Other Ambulatory Visit: Payer: Self-pay | Admitting: Physician Assistant

## 2022-11-07 DIAGNOSIS — I10 Essential (primary) hypertension: Secondary | ICD-10-CM

## 2022-12-29 ENCOUNTER — Telehealth (INDEPENDENT_AMBULATORY_CARE_PROVIDER_SITE_OTHER): Payer: No Typology Code available for payment source | Admitting: Family

## 2022-12-29 VITALS — Wt 171.0 lb

## 2022-12-29 DIAGNOSIS — B9789 Other viral agents as the cause of diseases classified elsewhere: Secondary | ICD-10-CM

## 2022-12-29 DIAGNOSIS — J019 Acute sinusitis, unspecified: Secondary | ICD-10-CM

## 2022-12-29 MED ORDER — TRIAMCINOLONE ACETONIDE 55 MCG/ACT NA AERO: 1.0000 | INHALATION_SPRAY | Freq: Every day | NASAL | 12 refills | Status: AC

## 2022-12-29 NOTE — Progress Notes (Signed)
MyChart Video Visit    Virtual Visit via Video Note   This format is felt to be most appropriate for this patient at this time. Physical exam was limited by quality of the video and audio technology used for the visit. CMA was able to get the patient set up on a video visit.  Patient location: Home. Patient and provider in visit Provider location: Office  I discussed the limitations of evaluation and management by telemedicine and the availability of in person appointments. The patient expressed understanding and agreed to proceed.  Visit Date: 12/29/2022  Today's healthcare provider: Jeanie Sewer, NP     Subjective:   Patient ID: Catherine Cox, female    DOB: [birth year], 47 y.o.   MRN: [id number]  Chief Complaint  Patient presents with   Sinus Problem    sx for 3d   HPI Sinus sx:  Pt c/o Nasal congestion, pressure behind eye, headaches and Greenish mucus. Present for 3 days. Covid negative today. Has tried robitussin, sudafed which did not help. Reports getting sinus infections, last treated in September with Augmentin, also has allergies, but not taking anything regularly, has used Zyrtec in past.   Assessment & Plan:  1. Acute viral sinusitis - sending steroid nasal spray, advised on use & SE.Use for 1-2w then ok to stop, but should start again end of February to head off spring allergy season.  Use saline nasal spray prior to medicated spray and can use tid to help disinfect and moisturize. Take Tylenol for headache. Drink plenty of water and can use humidifier overnight.  - triamcinolone (NASACORT) 55 MCG/ACT AERO nasal inhaler; Place 1 spray into the nose daily. Use twice a day for 3 days, then reduce to qd and continue for 1-2weeks until feeling better.  Dispense: 1 each; Refill: 12   Past Medical History:  Diagnosis Date   Environmental allergies    Migraine headache    [facility] Dr[facility], has been going since 2004    Past Surgical  History:  Procedure Laterality Date   TONSILLECTOMY      Outpatient Medications Prior to Visit  Medication Sig Dispense Refill   aspirin-acetaminophen-caffeine (EXCEDRIN MIGRAINE) 250-250-65 MG tablet Take 2 tablets by mouth every 6 (six) hours as needed for headache.     cyclobenzaprine (FLEXERIL) 10 MG tablet Take 10 mg by mouth 3 (three) times daily as needed for muscle spasms.     hydrochlorothiazide (HYDRODIURIL) 12.5 MG tablet TAKE 1 TABLET (12.5 MG TOTAL) BY MOUTH DAILY FOR BLOOD PRESSURE 90 tablet 0   medroxyPROGESTERone (DEPO-PROVERA) 150 MG/ML injection Inject 150 mg into the muscle. Every 8-10 weeks.      pantoprazole (PROTONIX) 20 MG tablet TAKE ONE TABLET BY MOUTH DAILY 90 tablet 1   rizatriptan (MAXALT) 10 MG tablet Take 10 mg by mouth as needed for migraine. May repeat in 2 hours if needed     amoxicillin-clavulanate (AUGMENTIN) 875-125 MG tablet Take 1 tablet by mouth 2 (two) times daily. (Patient not taking: Reported on 12/29/2022) 14 tablet 0   No facility-administered medications prior to visit.    Allergies  Allergen Reactions   Codeine Anaphylaxis       Objective:   Physical Exam Vitals and nursing note reviewed.  Constitutional:      General: Pt is not in acute distress.    Appearance: Normal appearance.  HENT:     Head: Normocephalic.  Pulmonary:     Effort: No respiratory distress.  Musculoskeletal:     Cervical back: Normal range of motion.  Skin:    General: Skin is dry.     Coloration: Skin is not pale.  Neurological:     Mental Status: Pt is alert and oriented to person, place, and time.  Psychiatric:        Mood and Affect: Mood normal.   Wt 171 lb (77.6 kg)   BMI 26.00 kg/m   Wt Readings from Last 3 Encounters:  12/29/22 171 lb (77.6 kg)  08/19/22 164 lb (74.4 kg)  05/11/22 160 lb (72.6 kg)      I discussed the assessment and treatment plan with the patient. The patient was provided an opportunity to ask questions and all were  answered. The patient agreed with the plan and demonstrated an understanding of the instructions.   The patient was advised to call back or seek an in-person evaluation if the symptoms worsen or if the condition fails to improve as anticipated.  Jeanie Sewer, NP [place] [phone] (phone) [phone] (fax)  [place]

## 2023-01-03 ENCOUNTER — Encounter: Payer: Self-pay | Admitting: Physician Assistant

## 2023-01-03 ENCOUNTER — Ambulatory Visit (INDEPENDENT_AMBULATORY_CARE_PROVIDER_SITE_OTHER): Payer: No Typology Code available for payment source | Admitting: Physician Assistant

## 2023-01-03 VITALS — BP 132/86 | HR 100 | Temp 98.4°F | Ht 68.0 in | Wt 171.0 lb

## 2023-01-03 DIAGNOSIS — R0981 Nasal congestion: Secondary | ICD-10-CM

## 2023-01-03 DIAGNOSIS — G43909 Migraine, unspecified, not intractable, without status migrainosus: Secondary | ICD-10-CM

## 2023-01-03 DIAGNOSIS — I10 Essential (primary) hypertension: Secondary | ICD-10-CM

## 2023-01-03 DIAGNOSIS — Z0000 Encounter for general adult medical examination without abnormal findings: Secondary | ICD-10-CM | POA: Diagnosis not present

## 2023-01-03 DIAGNOSIS — E663 Overweight: Secondary | ICD-10-CM | POA: Diagnosis not present

## 2023-01-03 MED ORDER — AMOXICILLIN-POT CLAVULANATE 875-125 MG PO TABS: 1.0000 | ORAL_TABLET | Freq: Two times a day (BID) | ORAL | 0 refills | Status: DC

## 2023-01-03 NOTE — Patient Instructions (Signed)
It was great to see you! ? ?Please go to the lab for blood work.  ? ?Our office will call you with your results unless you have chosen to receive results via MyChart. ? ?If your blood work is normal we will follow-up each year for physicals and as scheduled for chronic medical problems. ? ?If anything is abnormal we will treat accordingly and get you in for a follow-up. ? ?Take care, ? ?Neya Creegan ?  ? ? ?

## 2023-01-03 NOTE — Progress Notes (Signed)
Subjective:    Catherine Cox is a 47 y.o. female and is here for a comprehensive physical exam.  HPI  Health Maintenance Due  Topic Date Due   COLONOSCOPY (Pts 45-5yrs Insurance coverage will need to be confirmed)  Never done   DTaP/Tdap/Td (2 - Td or Tdap) 05/09/2022   COVID-19 Vaccine (4 - 2023-24 season) 08/05/2022    Acute Concerns: Sinusitis Sx x 1 week, did virtual visit last week with our office Has tried OTC nasal sprays, cough/cold medication Has significant sinus pressure and slight cough Denies: fevers, chills, body aches, poor appetite  Chronic Issues: HTN Currently taking HCTZ 12.5 mg daily. At home blood pressure readings are: not checked. Patient denies chest pain, SOB, blurred vision, dizziness, unusual headaches, lower leg swelling. Patient is compliant with medication. Denies excessive caffeine intake, stimulant usage, excessive alcohol intake, or increase in salt consumption.  BP Readings from Last 3 Encounters:  06/15/21 122/81  10/23/20 (!) 160/100  06/01/20 116/84   Migraines Currently taking maxalt 10 mg daily prn for migraine. Rare intake of maxalt.   Health Maintenance: Immunizations -- UTD, declined TDap Colonoscopy -- cologuard on 06/02/21 Mammogram -- UTD PAP -- UTD 12/2018 Bone Density -- n/a Diet -- healthy diet as able Exercise -- regular exercising, gets at least 5k steps daily  Sleep habits -- no major concerns Mood -- stable  UTD with dentist? - no   Weight history: Wt Readings from Last 10 Encounters:  12/29/22 171 lb (77.6 kg)  08/19/22 164 lb (74.4 kg)  05/11/22 160 lb (72.6 kg)  12/23/21 160 lb (72.6 kg)  10/01/21 163 lb (73.9 kg)  06/15/21 159 lb 9.6 oz (72.4 kg)  12/19/20 156 lb (70.8 kg)  10/23/20 157 lb 8 oz (71.4 kg)  08/28/20 153 lb (69.4 kg)  08/05/20 153 lb (69.4 kg)   There is no height or weight on file to calculate BMI. No LMP recorded. Patient has had an injection.  Alcohol use:  reports current  alcohol use.  Tobacco use:  Tobacco Use: Low Risk  (08/19/2022)   Patient History    Smoking Tobacco Use: Never    Smokeless Tobacco Use: Never    Passive Exposure: Not on file   Eligible for lung cancer screening? no     12/29/2022    5:03 PM  Depression screen PHQ 2/9  Decreased Interest 0  Down, Depressed, Hopeless 0  PHQ - 2 Score 0     Other providers/specialists: Patient Care Team: [name], [place] as PCP - General (Physician Assistant)    PMHx, SurgHx, SocialHx, Medications, and Allergies were reviewed in the Visit Navigator and updated as appropriate.   Past Medical History:  Diagnosis Date   Environmental allergies    Migraine headache    [facility] Dr[facility], has been going since 2004     Past Surgical History:  Procedure Laterality Date   TONSILLECTOMY       Family History  Problem Relation Age of Onset   Alzheimer's disease Mother    Hypertension Mother    Cancer Father        lymphoma and lung   Breast cancer Neg Hx    Colon cancer Neg Hx     Social History   Tobacco Use   Smoking status: Never   Smokeless tobacco: Never  Substance Use Topics   Alcohol use: Yes    Comment: occ   Drug use: No    Review of Systems:  ROS  Objective:   There were no vitals taken for this visit. There is no height or weight on file to calculate BMI.   General Appearance:    Alert, cooperative, no distress, appears stated age  Head:    Normocephalic, without obvious abnormality, atraumatic  Eyes:    PERRL, conjunctiva/corneas clear, EOM's intact, fundi    benign, both eyes  Ears:    Normal TM's and external ear canals, both ears  Nose:   Nares normal, septum midline, mucosa normal, no drainage    or sinus tenderness  Throat:   Lips, mucosa, and tongue normal; teeth and gums normal  Neck:   Supple, symmetrical, trachea midline, no adenopathy;    thyroid:  no enlargement/tenderness/nodules; no carotid   bruit or JVD  Back:      Symmetric, no curvature, ROM normal, no CVA tenderness  Lungs:     Clear to auscultation bilaterally, respirations unlabored  Chest Wall:    No tenderness or deformity   Heart:    Regular rate and rhythm, S1 and S2 normal, no murmur, rub or gallop  Breast Exam:    Deferred  Abdomen:     Soft, non-tender, bowel sounds active all four quadrants,    no masses, no organomegaly  Genitalia:    Deferred  Extremities:   Extremities normal, atraumatic, no cyanosis or edema  Pulses:   2+ and symmetric all extremities  Skin:   Skin color, texture, turgor normal, no rashes or lesions  Lymph nodes:   Cervical, supraclavicular, and axillary nodes normal  Neurologic:   CNII-XII intact, normal strength, sensation and reflexes    throughout    Assessment/Plan:   Routine physical examination Today patient counseled on age appropriate routine health concerns for screening and prevention, each reviewed and up to date or declined. Immunizations reviewed and up to date or declined. Labs ordered and reviewed. Risk factors for depression reviewed and negative. Hearing function and visual acuity are intact. ADLs screened and addressed as needed. Functional ability and level of safety reviewed and appropriate. Education, counseling and referrals performed based on assessed risks today. Patient provided with a copy of personalized plan for preventive services.  Essential hypertension Normotensive Continue hctz 12.5 mg daily Follow-up in 28mo to 40yr, sooner if concerns  Migraine without status migrainosus, not intractable, unspecified migraine type Well controlled Continue prn maxalt 10 mg Follow-up if new/worsening sx  Overweight Continue efforts at exercise and healthy eating  Nasal sinus congestion No red flags on exam.  Will initiate augmentin per orders. Discussed taking medications as prescribed. Reviewed return precautions including worsening fever, SOB, worsening cough or other concerns. Push fluids  and rest. I recommend that patient follow-up if symptoms worsen or persist despite treatment x 7-10 days, sooner if needed.   [name], PA-C [place]

## 2023-01-04 LAB — CBC WITH DIFFERENTIAL/PLATELET
Basophils Absolute: 0.1 10*3/uL (ref 0.0–0.1)
Basophils Relative: 1.1 % (ref 0.0–3.0)
Eosinophils Absolute: 0.3 10*3/uL (ref 0.0–0.7)
Eosinophils Relative: 4.2 % (ref 0.0–5.0)
HCT: 41 % (ref 36.0–46.0)
Hemoglobin: 13.9 g/dL (ref 12.0–15.0)
Lymphocytes Relative: 43.9 % (ref 12.0–46.0)
Lymphs Abs: 3.5 10*3/uL (ref 0.7–4.0)
MCHC: 34 g/dL (ref 30.0–36.0)
MCV: 87.3 fl (ref 78.0–100.0)
Monocytes Absolute: 0.6 10*3/uL (ref 0.1–1.0)
Monocytes Relative: 7.7 % (ref 3.0–12.0)
Neutro Abs: 3.4 10*3/uL (ref 1.4–7.7)
Neutrophils Relative %: 43.1 % (ref 43.0–77.0)
Platelets: 302 10*3/uL (ref 150.0–400.0)
RBC: 4.69 Mil/uL (ref 3.87–5.11)
RDW: 14.1 % (ref 11.5–15.5)
WBC: 7.9 10*3/uL (ref 4.0–10.5)

## 2023-01-04 LAB — COMPREHENSIVE METABOLIC PANEL
ALT: 11 U/L (ref 0–35)
AST: 15 U/L (ref 0–37)
Albumin: 4.3 g/dL (ref 3.5–5.2)
Alkaline Phosphatase: 64 U/L (ref 39–117)
BUN: 14 mg/dL (ref 6–23)
CO2: 27 mEq/L (ref 19–32)
Calcium: 9.5 mg/dL (ref 8.4–10.5)
Chloride: 102 mEq/L (ref 96–112)
Creatinine, Ser: 1.12 mg/dL (ref 0.40–1.20)
GFR: 57.92 mL/min — ABNORMAL LOW (ref >60.00)
Glucose, Bld: 84 mg/dL (ref 70–99)
Potassium: 3.8 mEq/L (ref 3.5–5.1)
Sodium: 139 mEq/L (ref 135–145)
Total Bilirubin: 0.4 mg/dL (ref 0.2–1.2)
Total Protein: 7.3 g/dL (ref 6.0–8.3)

## 2023-01-04 LAB — LIPID PANEL
Cholesterol: 205 mg/dL — ABNORMAL HIGH (ref 0–200)
HDL: 62 mg/dL (ref >39.00)
LDL Cholesterol: 127 mg/dL — ABNORMAL HIGH (ref 0–99)
NonHDL: 142.77
Total CHOL/HDL Ratio: 3
Triglycerides: 77 mg/dL (ref 0.0–149.0)
VLDL: 15.4 mg/dL (ref 0.0–40.0)

## 2023-01-13 ENCOUNTER — Encounter: Payer: Self-pay | Admitting: Family Medicine

## 2023-01-13 ENCOUNTER — Ambulatory Visit: Payer: No Typology Code available for payment source | Admitting: Family Medicine

## 2023-01-13 VITALS — BP 118/74 | HR 88 | Temp 97.3°F | Ht 68.0 in | Wt 166.6 lb

## 2023-01-13 DIAGNOSIS — R059 Cough, unspecified: Secondary | ICD-10-CM

## 2023-01-13 DIAGNOSIS — J309 Allergic rhinitis, unspecified: Secondary | ICD-10-CM

## 2023-01-13 DIAGNOSIS — I10 Essential (primary) hypertension: Secondary | ICD-10-CM

## 2023-01-13 MED ORDER — METHYLPREDNISOLONE ACETATE 80 MG/ML IJ SUSP
80.0000 mg | Freq: Once | INTRAMUSCULAR | Status: AC
  Administered 2023-01-13: 80 mg via INTRAMUSCULAR

## 2023-01-13 MED ORDER — ALBUTEROL SULFATE HFA 108 (90 BASE) MCG/ACT IN AERS: 2.0000 | INHALATION_SPRAY | Freq: Four times a day (QID) | RESPIRATORY_TRACT | 0 refills | Status: AC | PRN

## 2023-01-13 NOTE — Patient Instructions (Signed)
It was very nice to see you today!  We will give you a steroid shot today.  Please start the inhaler.  Let us know if not improving in the next 1 to 2 weeks.  Take care, Dr Jerline Pain  PLEASE NOTE:  If you had any lab tests, please let us know if you have not heard back within a few days. You may see your results on mychart before we have a chance to review them but we will give you a call once they are reviewed by [place].   If we ordered any referrals today, please let us know if you have not heard from their office within the next week.   If you had any urgent prescriptions sent in today, please check with the pharmacy within an hour of our visit to make sure the prescription was transmitted appropriately.   Please try these tips to maintain a healthy lifestyle:  Eat at least 3 REAL meals and 1-2 snacks per day.  Aim for no more than 5 hours between eating.  If you eat breakfast, please do so within one hour of getting up.   Each meal should contain half fruits/vegetables, one quarter protein, and one quarter carbs (no bigger than a computer mouse)  Cut down on sweet beverages. This includes juice, soda, and sweet tea.   Drink at least 1 glass of water with each meal and aim for at least 8 glasses per day  Exercise at least 150 minutes every week.

## 2023-01-13 NOTE — Progress Notes (Signed)
   Catherine Cox is a 47 y.o. female who presents today for an office visit.  Assessment/Plan:  New/Acute Problems: Cough No red flags.  She did recently complete a course of Augmentin for sinusitis recently. She likely has had a flareup of her underlying allergies / reactive airways based on her history.  She reports being evaluated for asthma in the past which was negative.  She will continue taking her allergy medications as below.  She has done well a steroids for this in the past.  She is agreeable to have injection of 80 mg of depomedrol today in place of steroid burst.  She is aware of potential side effects.  Will also send in albuterol inhaler.  She has done well with this in the past as well.  She can continue over-the-counter meds as needed.  Encouraged hydration.  She will let us know if not improving by next week.  Would consider referral to pulmonology versus imaging at that time.  Chronic Problems Addressed Today: Essential hypertension At goal today on HCTZ 12.5 mg daily  Allergic rhinitis On Nasacort.  Has followed with allergy in the past.  Will be treating current flare as above.  She can continue over-the-counter meds.    Subjective:  HPI:  See A/p for status of chronic conditions.   Primary concern today is cough and congestion.  This has been going on for a couple of weeks.  She saw her PCP about a week and a half ago for annual physical.  At that time she was diagnosed with sinusitis.  Started on Augmentin.  Her sinus congestion symptoms did improve however she has had persistent cough since then.  She is concerned about possible bronchitis.  She does get a flareup of bronchitis about once every year or so.  She has additionally had walking pneumonia in the past as well.  She took a COVID test at home which was negative.  She has had some subjective fevers.  No chills.  Over-the-counter meds have only given her modest benefit.  In the past she has gotten this about once  yearly.  Typically recovers with a course of steroids and albuterol inhaler.       Objective:  Physical Exam: BP 118/74   Pulse 88   Temp (!) 97.3 F (36.3 C) (Temporal)   Ht 5\' 8"  (1.727 m)   Wt 166 lb 9.6 oz (75.6 kg)   SpO2 98%   BMI 25.33 kg/m   Gen: No acute distress, resting comfortably CV: Regular rate and rhythm with no murmurs appreciated Pulm: Normal work of breathing, faint rhonchi noted throughout lung fields. Neuro: Grossly normal, moves all extremities Psych: Normal affect and thought content      Dillion Stowers M. Jerline Pain, MD 01/13/2023 12:01 PM

## 2023-01-13 NOTE — Addendum Note (Signed)
Addended by: Betti Cruz on: 01/13/2023 12:15 PM   Modules accepted: Orders

## 2023-02-03 ENCOUNTER — Other Ambulatory Visit: Payer: Self-pay | Admitting: Physician Assistant

## 2023-02-03 DIAGNOSIS — I10 Essential (primary) hypertension: Secondary | ICD-10-CM

## 2023-02-10 ENCOUNTER — Other Ambulatory Visit: Payer: Self-pay | Admitting: Physician Assistant

## 2023-04-13 ENCOUNTER — Telehealth (INDEPENDENT_AMBULATORY_CARE_PROVIDER_SITE_OTHER): Payer: No Typology Code available for payment source | Admitting: Physician Assistant

## 2023-04-13 ENCOUNTER — Encounter: Payer: Self-pay | Admitting: Physician Assistant

## 2023-04-13 VITALS — Ht 68.0 in | Wt 170.0 lb

## 2023-04-13 DIAGNOSIS — R0981 Nasal congestion: Secondary | ICD-10-CM

## 2023-04-13 MED ORDER — AMOXICILLIN-POT CLAVULANATE 875-125 MG PO TABS: 1.0000 | ORAL_TABLET | Freq: Two times a day (BID) | ORAL | 0 refills | Status: DC

## 2023-04-13 NOTE — Progress Notes (Signed)
Virtual Visit via Video Note   I, [name], connected with  Catherine Cox  ([id number], 1976-06-22) on 04/13/23 at  9:40 AM EDT by a video-enabled telemedicine application and verified that I am speaking with the correct person using two identifiers.  Location: Patient: Home Provider: [facility] office   I discussed the limitations of evaluation and management by telemedicine and the availability of in person appointments. The patient expressed understanding and agreed to proceed.    History of Present Illness: Catherine Cox is a 47 y.o. who identifies as a female who was assigned female at birth, and is being seen today for sinusitis.  Symptoms started within past week. Having nasal congestion, headache(s) and sinus pressure Had slight bloody discharge  from nose yesterday Taking nasacort as needed without relief  Denies: fevers, chills, shortness of breath, difficulty breathing    Problems:  Patient Active Problem List   Diagnosis Date Noted   GERD (gastroesophageal reflux disease) 06/15/2021   Essential hypertension 10/23/2020   Migraine 11/22/2016    Allergies:  Allergies  Allergen Reactions   Codeine Anaphylaxis   Medications:  Current Outpatient Medications:    hydrochlorothiazide (HYDRODIURIL) 12.5 MG tablet, TAKE 1 TABLET (12.5 MG TOTAL) BY MOUTH DAILY FOR BLOOD PRESSURE, Disp: 90 tablet, Rfl: 0   medroxyPROGESTERone (DEPO-PROVERA) 150 MG/ML injection, Inject 150 mg into the muscle. Every 8-10 weeks. , Disp: , Rfl:    pantoprazole (PROTONIX) 20 MG tablet, TAKE 1 TABLET BY MOUTH DAILY, Disp: 90 tablet, Rfl: 1   rizatriptan (MAXALT) 10 MG tablet, Take 10 mg by mouth as needed for migraine. May repeat in 2 hours if needed, Disp: , Rfl:    triamcinolone (NASACORT) 55 MCG/ACT AERO nasal inhaler, Place 1 spray into the nose daily. Use twice a day for 3 days, then reduce to qd and continue for 1-2weeks until feeling better., Disp: 1 each, Rfl: 12    albuterol (VENTOLIN HFA) 108 (90 Base) MCG/ACT inhaler, Inhale 2 puffs into the lungs every 6 (six) hours as needed for wheezing or shortness of breath. (Patient not taking: Reported on 04/13/2023), Disp: 8 g, Rfl: 0   amoxicillin-clavulanate (AUGMENTIN) 875-125 MG tablet, Take 1 tablet by mouth 2 (two) times daily., Disp: 14 tablet, Rfl: 0   aspirin-acetaminophen-caffeine (EXCEDRIN MIGRAINE) 250-250-65 MG tablet, Take 2 tablets by mouth every 6 (six) hours as needed for headache. (Patient not taking: Reported on 04/13/2023), Disp: , Rfl:    cyclobenzaprine (FLEXERIL) 10 MG tablet, Take 10 mg by mouth 3 (three) times daily as needed for muscle spasms. (Patient not taking: Reported on 04/13/2023), Disp: , Rfl:   Observations/Objective: Patient is well-developed, well-nourished in no acute distress.  Resting comfortably  at home.  Head is normocephalic, atraumatic.  No labored breathing.  Speech is clear and coherent with logical content.  Patient is alert and oriented at baseline.    Assessment and Plan: 1. Nasal sinus congestion  No red flags on exam.  Will initiate augmentin per orders. Discussed taking medications as prescribed. Reviewed return precautions including worsening fever, SOB, worsening cough or other concerns. Push fluids and rest. I recommend that patient follow-up if symptoms worsen or persist despite treatment x 7-10 days, sooner if needed.   Follow Up Instructions: I discussed the assessment and treatment plan with the patient. The patient was provided an opportunity to ask questions and all were answered. The patient agreed with the plan and demonstrated an understanding of the instructions.  A copy  of instructions were sent to the patient via MyChart unless otherwise noted below.   The patient was advised to call back or seek an in-person evaluation if the symptoms worsen or if the condition fails to improve as anticipated.  [name], [place]

## 2023-06-06 ENCOUNTER — Other Ambulatory Visit: Payer: Self-pay | Admitting: Physician Assistant

## 2023-06-06 DIAGNOSIS — I10 Essential (primary) hypertension: Secondary | ICD-10-CM

## 2023-07-25 LAB — HM MAMMOGRAPHY

## 2023-07-26 ENCOUNTER — Encounter: Payer: Self-pay | Admitting: Obstetrics and Gynecology

## 2023-08-10 ENCOUNTER — Other Ambulatory Visit: Payer: Self-pay | Admitting: Physician Assistant

## 2023-08-10 NOTE — Telephone Encounter (Signed)
Medication: Pantoprazole (Protonix) 20 mg  Directions: Take 1 tablet by mouth daily  Last given: 02/10/23 Number refills: 1 Last o/v: 04/13/23 (video visit)  Follow up: Not on file (Physical due January 2025) Labs:

## 2023-08-20 ENCOUNTER — Other Ambulatory Visit: Payer: Self-pay | Admitting: Physician Assistant

## 2023-08-20 DIAGNOSIS — I10 Essential (primary) hypertension: Secondary | ICD-10-CM

## 2023-11-16 ENCOUNTER — Telehealth: Payer: Self-pay | Admitting: Physician Assistant

## 2023-11-16 NOTE — Telephone Encounter (Signed)
Prescription Request  11/16/2023  LOV: 01/03/2023  What is the name of the medication or equipment? hydrochlorothiazide (HYDRODIURIL) 12.5 MG tablet   Have you contacted your pharmacy to request a refill? Yes   Which pharmacy would you like this sent to?   [id number] [place], [place] - [address] Phone: [phone]  Fax: [phone]      Patient notified that their request is being sent to the clinical staff for review and that they should receive a response within 2 business days.   Please advise at Mobile [phone] (mobile)

## 2023-11-17 ENCOUNTER — Other Ambulatory Visit: Payer: Self-pay

## 2023-11-17 DIAGNOSIS — I10 Essential (primary) hypertension: Secondary | ICD-10-CM

## 2023-11-17 MED ORDER — HYDROCHLOROTHIAZIDE 12.5 MG PO TABS
12.5000 mg | ORAL_TABLET | Freq: Every day | ORAL | 0 refills | Status: DC
Start: 2023-11-17 — End: 2024-02-22

## 2023-11-17 NOTE — Telephone Encounter (Signed)
Rx sent to pharmacy per pt request 

## 2023-12-25 ENCOUNTER — Encounter: Payer: Self-pay | Admitting: Physician Assistant

## 2023-12-25 ENCOUNTER — Telehealth (INDEPENDENT_AMBULATORY_CARE_PROVIDER_SITE_OTHER): Payer: 59 | Admitting: Physician Assistant

## 2023-12-25 VITALS — Ht 68.0 in | Wt 164.0 lb

## 2023-12-25 DIAGNOSIS — R0981 Nasal congestion: Secondary | ICD-10-CM | POA: Diagnosis not present

## 2023-12-25 MED ORDER — PSEUDOEPH-BROMPHEN-DM 30-2-10 MG/5ML PO SYRP: 2.5000 mL | ORAL_SOLUTION | Freq: Four times a day (QID) | ORAL | 0 refills | Status: DC | PRN

## 2023-12-25 MED ORDER — AMOXICILLIN-POT CLAVULANATE 875-125 MG PO TABS: 1.0000 | ORAL_TABLET | Freq: Two times a day (BID) | ORAL | 0 refills | Status: DC

## 2023-12-25 NOTE — Progress Notes (Signed)
I acted as a Neurosurgeon for [employer], PA-C Corky Mull, LPN  Virtual Visit via Video Note   I, [name], PA, connected with  Lucyanna Gutherie Klumb  ([id number], 13-May-1976) on 12/25/23 at  9:00 AM EST by a video-enabled telemedicine application and verified that I am speaking with the correct person using two identifiers.  Location: Patient: Home Provider: [facility] office   I discussed the limitations of evaluation and management by telemedicine and the availability of in person appointments. The patient expressed understanding and agreed to proceed.    History of Present Illness: Catherine Cox is a 48 y.o. who identifies as a female who was assigned female at birth, and is being seen today for sinus problem. Pt c/o sinus pressure and headache, post nasal drip, yellow/green nasal drainage, x 2 days. Denies fever. Home COVID test was Negative.  She also reports some fatigue.  She is taking Robitussin at home without significant relief of her cough.  Denies any sick contacts.  Problems:  Patient Active Problem List   Diagnosis Date Noted   GERD (gastroesophageal reflux disease) 06/15/2021   Essential hypertension 10/23/2020   Migraine 11/22/2016    Allergies:  Allergies  Allergen Reactions   Codeine Anaphylaxis   Medications:  Current Outpatient Medications:    albuterol (VENTOLIN HFA) 108 (90 Base) MCG/ACT inhaler, Inhale 2 puffs into the lungs every 6 (six) hours as needed for wheezing or shortness of breath., Disp: 8 g, Rfl: 0   amoxicillin-clavulanate (AUGMENTIN) 875-125 MG tablet, Take 1 tablet by mouth 2 (two) times daily., Disp: 14 tablet, Rfl: 0   aspirin-acetaminophen-caffeine (EXCEDRIN MIGRAINE) 250-250-65 MG tablet, Take 2 tablets by mouth every 6 (six) hours as needed for headache., Disp: , Rfl:    brompheniramine-pseudoephedrine-DM 30-2-10 MG/5ML syrup, Take 2.5 mLs by mouth 4 (four) times daily as needed., Disp: 120 mL, Rfl: 0   cyclobenzaprine  (FLEXERIL) 10 MG tablet, Take 10 mg by mouth 3 (three) times daily as needed for muscle spasms., Disp: , Rfl:    hydrochlorothiazide (HYDRODIURIL) 12.5 MG tablet, Take 1 tablet (12.5 mg total) by mouth daily. For blood pressure., Disp: 90 tablet, Rfl: 0   medroxyPROGESTERone (DEPO-PROVERA) 150 MG/ML injection, Inject 150 mg into the muscle. Every 8-10 weeks. , Disp: , Rfl:    pantoprazole (PROTONIX) 20 MG tablet, TAKE 1 TABLET BY MOUTH DAILY, Disp: 90 tablet, Rfl: 1   rizatriptan (MAXALT) 10 MG tablet, Take 10 mg by mouth as needed for migraine. May repeat in 2 hours if needed, Disp: , Rfl:    triamcinolone (NASACORT) 55 MCG/ACT AERO nasal inhaler, Place 1 spray into the nose daily. Use twice a day for 3 days, then reduce to qd and continue for 1-2weeks until feeling better., Disp: 1 each, Rfl: 12  Observations/Objective: Patient is well-developed, well-nourished in no acute distress.  Resting comfortably at home.  Head is normocephalic, atraumatic.  No labored breathing.  Speech is clear and coherent with logical content.  Patient is alert and oriented at baseline.    Assessment and Plan: 1. Nasal sinus congestion (Primary) No red flags on exam.   Will initiate Augmentin and bromfed cough syrup per orders.  Discussed taking medications as prescribed.  Reviewed return precautions including new or worsening fever, SOB, new or worsening cough or other concerns.  Push fluids and rest.  I recommend that patient follow-up if symptoms worsen or persist despite treatment x 7-10 days, sooner if needed.  We also discussed that  if her fatigue does not improve with treatment of this illness that she needs to follow up with [place] for further evaluation  Follow Up Instructions: I discussed the assessment and treatment plan with the patient. The patient was provided an opportunity to ask questions and all were answered. The patient agreed with the plan and demonstrated an understanding of the  instructions.  A copy of instructions were sent to the patient via MyChart unless otherwise noted below.   The patient was advised to call back or seek an in-person evaluation if the symptoms worsen or if the condition fails to improve as anticipated.  [name], [place]

## 2023-12-26 ENCOUNTER — Encounter: Payer: Self-pay | Admitting: Physician Assistant

## 2024-02-09 ENCOUNTER — Other Ambulatory Visit: Payer: Self-pay | Admitting: Physician Assistant

## 2024-02-22 ENCOUNTER — Other Ambulatory Visit: Payer: Self-pay | Admitting: Physician Assistant

## 2024-02-22 DIAGNOSIS — I10 Essential (primary) hypertension: Secondary | ICD-10-CM

## 2024-05-12 ENCOUNTER — Other Ambulatory Visit: Payer: Self-pay | Admitting: Physician Assistant

## 2024-05-29 ENCOUNTER — Other Ambulatory Visit: Payer: Self-pay | Admitting: Physician Assistant

## 2024-05-29 DIAGNOSIS — I10 Essential (primary) hypertension: Secondary | ICD-10-CM

## 2024-06-29 LAB — COLOGUARD: COLOGUARD: NEGATIVE

## 2024-08-11 ENCOUNTER — Other Ambulatory Visit: Payer: Self-pay | Admitting: Physician Assistant

## 2024-08-15 LAB — HM MAMMOGRAPHY

## 2024-08-19 LAB — HM PAP SMEAR: HPV, high-risk: NOT DETECTED

## 2024-08-25 ENCOUNTER — Other Ambulatory Visit: Payer: Self-pay | Admitting: Physician Assistant

## 2024-08-25 DIAGNOSIS — I10 Essential (primary) hypertension: Secondary | ICD-10-CM

## 2024-09-11 ENCOUNTER — Telehealth: Admitting: Physician Assistant

## 2024-09-11 DIAGNOSIS — R0981 Nasal congestion: Secondary | ICD-10-CM

## 2024-09-11 MED ORDER — AMOXICILLIN-POT CLAVULANATE 875-125 MG PO TABS: 1.0000 | ORAL_TABLET | Freq: Two times a day (BID) | ORAL | 0 refills | Status: DC

## 2024-09-11 NOTE — Progress Notes (Signed)
 Virtual Visit via Video Note   I, Lucie Buttner, connected with  Catherine Cox  ([id number], 1976-07-13) on 09/11/24 at  1:00 PM EDT by a video-enabled telemedicine application and verified that I am speaking with the correct person using two identifiers.  Location: Patient: Home Provider: [facility] office   I discussed the limitations of evaluation and management by telemedicine and the availability of in person appointments. The patient expressed understanding and agreed to proceed.    History of Present Illness: Catherine Cox is a 48 y.o. who identifies as a female who was assigned female at birth, and is being seen today for sinus pressure.   She has recurrent sinusitis Symptom(s) flare with weather changes as we are dealing with in our community now Reports there is no fever, chills  She had to go home from work early last week Reports there is no flu-like symptom(s) Taking OTC (available over the counter without a prescription) medications for supportive   Problems:  Patient Active Problem List   Diagnosis Date Noted   GERD (gastroesophageal reflux disease) 06/15/2021   Essential hypertension 10/23/2020   Migraine 11/22/2016    Allergies:  Allergies  Allergen Reactions   Codeine Anaphylaxis   Medications:  Current Outpatient Medications:    amoxicillin -clavulanate (AUGMENTIN ) 875-125 MG tablet, Take 1 tablet by mouth 2 (two) times daily., Disp: 20 tablet, Rfl: 0   albuterol  (VENTOLIN  HFA) 108 (90 Base) MCG/ACT inhaler, Inhale 2 puffs into the lungs every 6 (six) hours as needed for wheezing or shortness of breath., Disp: 8 g, Rfl: 0   aspirin-acetaminophen-caffeine (EXCEDRIN MIGRAINE) 250-250-65 MG tablet, Take 2 tablets by mouth every 6 (six) hours as needed for headache., Disp: , Rfl:    brompheniramine-pseudoephedrine-DM 30-2-10 MG/5ML syrup, Take 2.5 mLs by mouth 4 (four) times daily as needed., Disp: 120 mL, Rfl: 0   cyclobenzaprine (FLEXERIL) 10 MG  tablet, Take 10 mg by mouth 3 (three) times daily as needed for muscle spasms., Disp: , Rfl:    hydrochlorothiazide  (HYDRODIURIL ) 12.5 MG tablet, TAKE 1 TABLET (12.5 MG TOTAL) BY MOUTH DAILY FOR BLOOD PRESSURE, Disp: 90 tablet, Rfl: 0   medroxyPROGESTERone (DEPO-PROVERA) 150 MG/ML injection, Inject 150 mg into the muscle. Every 8-10 weeks. , Disp: , Rfl:    pantoprazole  (PROTONIX ) 20 MG tablet, TAKE 1 TABLET BY MOUTH DAILY **PLEASE SCHEDULE VISIT FOR ADDITIONAL REFILLS**, Disp: 90 tablet, Rfl: 0   rizatriptan (MAXALT) 10 MG tablet, Take 10 mg by mouth as needed for migraine. May repeat in 2 hours if needed, Disp: , Rfl:    triamcinolone  (NASACORT ) 55 MCG/ACT AERO nasal inhaler, Place 1 spray into the nose daily. Use twice a day for 3 days, then reduce to qd and continue for 1-2weeks until feeling better., Disp: 1 each, Rfl: 12  Observations/Objective: Patient is well-developed, well-nourished in no acute distress.  Resting comfortably  at home.  Head is normocephalic, atraumatic.  No labored breathing.  Speech is clear and coherent with logical content.  Patient is alert and oriented at baseline.   Assessment and Plan: 1. Nasal sinus congestion (Primary) No red flags on discussion   Will initiate augmentin  per orders.  Discussed taking medications as prescribed.  Reviewed return precautions including new or worsening fever, SOB, new or worsening cough or other concerns.  Push fluids and rest.  I recommend that patient follow-up if symptoms worsen or persist despite treatment x 7-10 days, sooner if needed.   Follow Up Instructions: I discussed  the assessment and treatment plan with the patient. The patient was provided an opportunity to ask questions and all were answered. The patient agreed with the plan and demonstrated an understanding of the instructions.  A copy of instructions were sent to the patient via MyChart unless otherwise noted below.   The patient was advised to call back or  seek an in-person evaluation if the symptoms worsen or if the condition fails to improve as anticipated.  Lucie Buttner, [place]

## 2024-11-12 ENCOUNTER — Other Ambulatory Visit: Payer: Self-pay | Admitting: Physician Assistant

## 2024-11-14 ENCOUNTER — Other Ambulatory Visit: Payer: Self-pay | Admitting: Physician Assistant

## 2024-11-14 DIAGNOSIS — I10 Essential (primary) hypertension: Secondary | ICD-10-CM

## 2024-11-15 ENCOUNTER — Other Ambulatory Visit: Payer: Self-pay | Admitting: Physician Assistant

## 2024-11-15 DIAGNOSIS — I10 Essential (primary) hypertension: Secondary | ICD-10-CM

## 2024-11-15 NOTE — Telephone Encounter (Unsigned)
 Copied from CRM #[id number]. Topic: Clinical - Medication Refill >> Nov 15, 2024 11:26 AM Laymon HERO wrote: Medication: hydrochlorothiazide  (HYDRODIURIL ) 12.5 MG tablet  Has the patient contacted their pharmacy? Yes (Agent: If no, request that the patient contact the pharmacy for the refill. If patient does not wish to contact the pharmacy document the reason why and proceed with request.) (Agent: If yes, when and what did the pharmacy advise?)  This is the patient's preferred pharmacy:  [id number] [place], [place] - [address] [place] [address] Phone: [phone] Fax: [phone]   Is this the correct pharmacy for this prescription? Yes If no, delete pharmacy and type the correct one.   Has the prescription been filled recently? Yes  Is the patient out of the medication? Yes  Has the patient been seen for an appointment in the last year OR does the patient have an upcoming appointment? Yes  Can we respond through MyChart? Yes  Agent: Please be advised that Rx refills may take up to 3 business days. We ask that you follow-up with your pharmacy.

## 2024-11-16 ENCOUNTER — Other Ambulatory Visit: Payer: Self-pay | Admitting: Physician Assistant

## 2024-11-29 ENCOUNTER — Other Ambulatory Visit: Payer: Self-pay | Admitting: Physician Assistant

## 2024-11-30 ENCOUNTER — Telehealth: Admitting: Nurse Practitioner

## 2024-11-30 DIAGNOSIS — J019 Acute sinusitis, unspecified: Secondary | ICD-10-CM | POA: Diagnosis not present

## 2024-11-30 DIAGNOSIS — B9789 Other viral agents as the cause of diseases classified elsewhere: Secondary | ICD-10-CM | POA: Diagnosis not present

## 2024-11-30 MED ORDER — LIDOCAINE VISCOUS HCL 2 % MT SOLN: 15.0000 mL | OROMUCOSAL | 0 refills | Status: AC | PRN

## 2024-11-30 MED ORDER — IPRATROPIUM BROMIDE 0.03 % NA SOLN: 2.0000 | Freq: Two times a day (BID) | NASAL | 0 refills | Status: AC

## 2024-11-30 NOTE — Progress Notes (Signed)
 We are sorry that you are not feeling well.  Here is how we plan to help!  Based on what you have shared with me it looks like you have sinusitis.  Sinusitis is inflammation and infection in the sinus cavities of the head.  Based on your presentation I believe you most likely have Acute Viral Sinusitis.This is an infection most likely caused by a virus. There is not specific treatment for viral sinusitis other than to help you with the symptoms until the infection runs its course.  You may use an oral decongestant such as Mucinex D or if you have glaucoma or high blood pressure use plain Mucinex. Saline nasal spray help and can safely be used as often as needed for congestion, I have prescribed: Ipratropium Bromide  nasal spray 0.03% 2 sprays in eah nostril 2-3 times a day and viscous lidocaine  for sore throat. If you are not feeling better in 5-7 days please feel free to reach out to us  for consideration of antibiotics.   Providers prescribe antibiotics to treat infections caused by bacteria. Antibiotics are very powerful in treating bacterial infections when they are used properly. To maintain their effectiveness, they should be used only when necessary. Overuse of antibiotics has resulted in the development of superbugs that are resistant to treatment!    After careful review of your answers, I would not recommend an antibiotic for your condition.  Antibiotics are not effective against viruses and therefore should not be used to treat them. Common examples of infections caused by viruses include colds and flu   Some authorities believe that zinc sprays or the use of Echinacea may shorten the course of your symptoms.  Sinus infections are not as easily transmitted as other respiratory infection, however we still recommend that you avoid close contact with loved ones, especially the very young and elderly.  Remember to wash your hands thoroughly throughout the day as this is the number one way to prevent  the spread of infection!  Home Care: Only take medications as instructed by your medical team. Do not take these medications with alcohol. A steam or ultrasonic humidifier can help congestion.  You can place a towel over your head and breathe in the steam from hot water coming from a faucet. Avoid close contacts especially the very young and the elderly. Cover your mouth when you cough or sneeze. Always remember to wash your hands.  Get Help Right Away If: You develop worsening fever or sinus pain. You develop a severe head ache or visual changes. Your symptoms persist after you have completed your treatment plan.  Make sure you Understand these instructions. Will watch your condition. Will get help right away if you are not doing well or get worse.  Your e-visit answers were reviewed by a board certified advanced clinical practitioner to complete your personal care plan.  Depending on the condition, your plan could have included both over the counter or prescription medications.  If there is a problem please reply  once you have received a response from your provider.  Your safety is important to us .  If you have drug allergies check your prescription carefully.    You can use MyChart to ask questions about todays visit, request a non-urgent call back, or ask for a work or school excuse for 24 hours related to this e-Visit. If it has been greater than 24 hours you will need to follow up with your provider, or enter a new e-Visit to address those concerns.  You will get an e-mail in the next two days asking about your experience.  I hope that your e-visit has been valuable and will speed your recovery. Thank you for using e-visits.  I have spent 5 minutes in review of e-visit questionnaire, review and updating patient chart, medical decision making and response to patient.   Addyson Traub W Dailey Alberson, NP

## 2024-12-03 ENCOUNTER — Telehealth: Payer: Self-pay | Admitting: *Deleted

## 2024-12-03 NOTE — Telephone Encounter (Signed)
 Copied from CRM #[id number]. Topic: Clinical - Medication Question >> Dec 03, 2024  9:15 AM Vena HERO wrote: Reason for CRM: Pt called in because she had a virtual visit on 12/27 and was told that if she was not feeling better to call back in. She is requesting some cough medicine and [facility]icillin  [facility]. Please call pt for next steps

## 2024-12-03 NOTE — Telephone Encounter (Signed)
 Pt called regarding message sent earlier for Rx's to be filled. Spoke to pt told her we can not give her Rx's because she was not seen by one of our providers she would need a visit. Told her I sent a message to the provider she saw on her E-visit 12/27. Hopefully she will respond. Pt verbalized understanding and asked if Lucie could do it? Told her Lucie is out of the office for the week. Pt verbalized understanding.

## 2024-12-03 NOTE — Telephone Encounter (Signed)
Please see message from patient

## 2024-12-06 ENCOUNTER — Telehealth (INDEPENDENT_AMBULATORY_CARE_PROVIDER_SITE_OTHER): Admitting: Family Medicine

## 2024-12-06 VITALS — BP 118/78 | Temp 98.4°F | Wt 164.6 lb

## 2024-12-06 DIAGNOSIS — J0100 Acute maxillary sinusitis, unspecified: Secondary | ICD-10-CM | POA: Diagnosis not present

## 2024-12-06 MED ORDER — AZITHROMYCIN 250 MG PO TABS: ORAL_TABLET | ORAL | 0 refills | Status: AC

## 2024-12-06 MED ORDER — BENZONATATE 200 MG PO CAPS: 200.0000 mg | ORAL_CAPSULE | Freq: Two times a day (BID) | ORAL | 0 refills | Status: AC | PRN

## 2024-12-06 NOTE — Progress Notes (Signed)
 "  Virtual Medical Office Visit  Patient:  Catherine Cox      Age: 49 y.o.       Sex:  female  Date:   12/06/2024  PCP:    Job Lukes, PA   Today's Healthcare Provider: Heron CHRISTELLA Sharper, MD    Assessment/Plan:   Summary assessment:  Catherine Cox was seen today for sinus problem and cough.  Acute non-recurrent maxillary sinusitis -     Azithromycin ; Take 2 tablets on day 1, then 1 tablet daily on days 2 through 5  Dispense: 6 tablet; Refill: 0 -     Benzonatate ; Take 1 capsule (200 mg total) by mouth 2 (two) times daily as needed for cough.  Dispense: 20 capsule; Refill: 0   Assessment and Plan    Acute maxillary sinusitis Symptoms persisting for four days. While I believe this is either seasonal allergies or viral, the patient strongly believed she required antibiotics for her condition. No fever, chills, sore throat, facial pain, shortness of breath, or chest pain. Previous OTC treatment prescribed by the e-visit was ineffective, necessitating a change in antibiotic to prevent resistance. - Prescribed azithromycin : two doses on the first day, followed by one dose daily for a total of five days. - Prescribed benzonatate  (Tessalon  Perles) as a pure cough suppressant, one capsule twice a day as needed, with caution for drowsiness. - Sent prescriptions to pharmacy.        No follow-ups on file.   She was advised to call the office or go to ER if her condition worsens    Subjective:   Catherine Cox is a 49 y.o. female with PMH significant for: Past Medical History:  Diagnosis Date   Environmental allergies    Migraine headache    [facility] -- Dr. Oneita, has been going since 2004     Presenting today with: Chief Complaint  Patient presents with   Sinus Problem    Patient complains of sinus pressure and headaches, bloody nasal drainage x2 days   Cough    Productive with yellow sputum x2 days, tried Robitussin and Sudafed with some relief, also states the  home Covid test was negative     She clarifies and reports that her condition: Discussed the use of AI scribe software for clinical note transcription with the patient, who gave verbal consent to proceed.  History of Present Illness   Catherine Cox is a 49 year old female who presents with a cough and cold symptoms for four days.  She reports 4 days of cough and cold symptoms that are worse at night. She denies fever, chills, sore throat, facial pain, shortness of breath, and chest pain. She has used Robitussin and Sudafed with partial relief and has not used antihistamines or nasal sprays. She has no asthma or other lung disease.       She denies having any: Chest pain, SOB, fever or chills.          Objective/Observations  Physical Exam:  Polite and friendly Gen: NAD, resting comfortably Pulm: Normal work of breathing Neuro: Grossly normal, moves all extremities Psych: Normal affect and thought content Problem specific physical exam findings:    No images are attached to the encounter or orders placed in the encounter.    Results: No results found for any visits on 12/06/24.   No results found for this or any previous visit (from the past 2160 hours).         Virtual  Visit via Video   I connected with Catherine Cox on 12/06/2024 at  8:00 AM EST by a video enabled telemedicine application and verified that I am speaking with the correct person using two identifiers. The limitations of evaluation and management by telemedicine and the availability of in person appointments were discussed. The patient expressed understanding and agreed to proceed.   Percentage of appointment time on video:  100% Patient location: Home Provider location: [facility] Persons participating in the virtual visit: Myself and Patient     "
